# Patient Record
Sex: Female | Born: 1975 | Race: Black or African American | Hispanic: No | Marital: Married | Smoking: Light tobacco smoker
Health system: Southern US, Community
[De-identification: ages and names within clinical notes are randomized; demographics above are authoritative.]

## PROBLEM LIST (undated history)

## (undated) DIAGNOSIS — I499 Cardiac arrhythmia, unspecified: Secondary | ICD-10-CM

## (undated) DIAGNOSIS — F329 Major depressive disorder, single episode, unspecified: Secondary | ICD-10-CM

## (undated) DIAGNOSIS — I10 Essential (primary) hypertension: Secondary | ICD-10-CM

## (undated) DIAGNOSIS — L709 Acne, unspecified: Secondary | ICD-10-CM

## (undated) DIAGNOSIS — D62 Acute posthemorrhagic anemia: Secondary | ICD-10-CM

## (undated) DIAGNOSIS — F32A Depression, unspecified: Secondary | ICD-10-CM

## (undated) DIAGNOSIS — Z8619 Personal history of other infectious and parasitic diseases: Secondary | ICD-10-CM

## (undated) DIAGNOSIS — O10919 Unspecified pre-existing hypertension complicating pregnancy, unspecified trimester: Secondary | ICD-10-CM

## (undated) DIAGNOSIS — E785 Hyperlipidemia, unspecified: Secondary | ICD-10-CM

## (undated) DIAGNOSIS — Z98891 History of uterine scar from previous surgery: Secondary | ICD-10-CM

## (undated) HISTORY — DX: Hyperlipidemia, unspecified: E78.5

## (undated) HISTORY — DX: Major depressive disorder, single episode, unspecified: F32.9

## (undated) HISTORY — DX: Cardiac arrhythmia, unspecified: I49.9

## (undated) HISTORY — DX: Essential (primary) hypertension: I10

## (undated) HISTORY — DX: Personal history of other infectious and parasitic diseases: Z86.19

## (undated) HISTORY — PX: EYE SURGERY: SHX253

## (undated) HISTORY — PX: WISDOM TOOTH EXTRACTION: SHX21

## (undated) HISTORY — PX: BLEPHAROPLASTY: SUR158

## (undated) HISTORY — DX: Acne, unspecified: L70.9

## (undated) HISTORY — DX: Depression, unspecified: F32.A

---

## 2003-08-09 ENCOUNTER — Emergency Department (HOSPITAL_COMMUNITY): Admission: EM | Admit: 2003-08-09 | Discharge: 2003-08-09 | Payer: Self-pay | Admitting: Family Medicine

## 2004-11-13 ENCOUNTER — Emergency Department (HOSPITAL_COMMUNITY): Admission: EM | Admit: 2004-11-13 | Discharge: 2004-11-13 | Payer: Self-pay | Admitting: Emergency Medicine

## 2011-07-05 LAB — OB RESULTS CONSOLE ANTIBODY SCREEN: Antibody Screen: NEGATIVE

## 2011-07-05 LAB — OB RESULTS CONSOLE HEPATITIS B SURFACE ANTIGEN: Hepatitis B Surface Ag: NEGATIVE

## 2011-07-05 LAB — OB RESULTS CONSOLE RUBELLA ANTIBODY, IGM: Rubella: IMMUNE

## 2011-07-20 LAB — OB RESULTS CONSOLE ABO/RH

## 2011-07-20 LAB — OB RESULTS CONSOLE GC/CHLAMYDIA: Gonorrhea: NEGATIVE

## 2012-01-27 ENCOUNTER — Encounter (HOSPITAL_COMMUNITY): Payer: Self-pay | Admitting: *Deleted

## 2012-01-27 ENCOUNTER — Other Ambulatory Visit: Payer: Self-pay | Admitting: Obstetrics and Gynecology

## 2012-01-27 ENCOUNTER — Telehealth (HOSPITAL_COMMUNITY): Payer: Self-pay | Admitting: *Deleted

## 2012-01-27 NOTE — Telephone Encounter (Signed)
Preadmission screen  

## 2012-01-28 ENCOUNTER — Telehealth (HOSPITAL_COMMUNITY): Payer: Self-pay | Admitting: *Deleted

## 2012-01-28 ENCOUNTER — Encounter (HOSPITAL_COMMUNITY): Payer: Self-pay | Admitting: *Deleted

## 2012-01-28 NOTE — Telephone Encounter (Signed)
Preadmission screen  

## 2012-02-05 ENCOUNTER — Encounter (HOSPITAL_COMMUNITY): Payer: Self-pay

## 2012-02-05 ENCOUNTER — Inpatient Hospital Stay (HOSPITAL_COMMUNITY)
Admission: AD | Admit: 2012-02-05 | Payer: BC Managed Care – PPO | Source: Ambulatory Visit | Admitting: Obstetrics and Gynecology

## 2012-02-05 ENCOUNTER — Encounter (HOSPITAL_COMMUNITY): Payer: Self-pay | Admitting: Pharmacist

## 2012-02-05 ENCOUNTER — Inpatient Hospital Stay (HOSPITAL_COMMUNITY)
Admission: RE | Admit: 2012-02-05 | Discharge: 2012-02-10 | DRG: 650 | Disposition: A | Discharge status: Home / Self Care | Payer: BC Managed Care – PPO | Source: Ambulatory Visit | Attending: Obstetrics and Gynecology | Admitting: Obstetrics and Gynecology

## 2012-02-05 VITALS — BP 130/80 | HR 97 | Temp 98.2°F | Resp 18 | Ht 64.0 in | Wt 227.0 lb

## 2012-02-05 DIAGNOSIS — O411090 Infection of amniotic sac and membranes, unspecified, unspecified trimester, not applicable or unspecified: Secondary | ICD-10-CM | POA: Diagnosis present

## 2012-02-05 DIAGNOSIS — Z98891 History of uterine scar from previous surgery: Secondary | ICD-10-CM

## 2012-02-05 DIAGNOSIS — O1002 Pre-existing essential hypertension complicating childbirth: Principal | ICD-10-CM | POA: Diagnosis present

## 2012-02-05 DIAGNOSIS — D62 Acute posthemorrhagic anemia: Secondary | ICD-10-CM | POA: Diagnosis not present

## 2012-02-05 DIAGNOSIS — Z22330 Carrier of Group B streptococcus: Secondary | ICD-10-CM

## 2012-02-05 DIAGNOSIS — O99892 Other specified diseases and conditions complicating childbirth: Secondary | ICD-10-CM | POA: Diagnosis present

## 2012-02-05 DIAGNOSIS — O9903 Anemia complicating the puerperium: Secondary | ICD-10-CM | POA: Diagnosis not present

## 2012-02-05 DIAGNOSIS — O324XX0 Maternal care for high head at term, not applicable or unspecified: Secondary | ICD-10-CM | POA: Diagnosis present

## 2012-02-05 DIAGNOSIS — O10919 Unspecified pre-existing hypertension complicating pregnancy, unspecified trimester: Secondary | ICD-10-CM | POA: Diagnosis present

## 2012-02-05 HISTORY — DX: Unspecified pre-existing hypertension complicating pregnancy, unspecified trimester: O10.919

## 2012-02-05 HISTORY — DX: History of uterine scar from previous surgery: Z98.891

## 2012-02-05 HISTORY — DX: Acute posthemorrhagic anemia: D62

## 2012-02-05 LAB — CBC
Hemoglobin: 11.8 g/dL — ABNORMAL LOW (ref 12.0–15.0)
MCH: 29.4 pg (ref 26.0–34.0)
MCV: 86.3 fL (ref 78.0–100.0)
RBC: 4.01 MIL/uL (ref 3.87–5.11)

## 2012-02-05 MED ORDER — LACTATED RINGERS IV SOLN
500.0000 mL | INTRAVENOUS | Status: DC | PRN
Start: 2012-02-05 — End: 2012-02-07

## 2012-02-05 MED ORDER — LACTATED RINGERS IV SOLN
INTRAVENOUS | Status: DC
  Administered 2012-02-05 – 2012-02-07 (×6): via INTRAVENOUS

## 2012-02-05 MED ORDER — OXYTOCIN BOLUS FROM INFUSION
500.0000 mL | Freq: Once | INTRAVENOUS | Status: DC
  Filled 2012-02-05: qty 500

## 2012-02-05 MED ORDER — TERBUTALINE SULFATE 1 MG/ML IJ SOLN: 0.2500 mg | Freq: Once | INTRAMUSCULAR | Status: AC | PRN

## 2012-02-05 MED ORDER — OXYTOCIN 40 UNITS IN LACTATED RINGERS INFUSION - SIMPLE MED
1.0000 m[IU]/min | INTRAVENOUS | Status: DC
  Administered 2012-02-05: 2 m[IU]/min via INTRAVENOUS
  Administered 2012-02-06: 18 m[IU]/min via INTRAVENOUS

## 2012-02-05 MED ORDER — IBUPROFEN 600 MG PO TABS: 600.0000 mg | ORAL_TABLET | Freq: Four times a day (QID) | ORAL | Status: DC | PRN

## 2012-02-05 MED ORDER — OXYTOCIN 40 UNITS IN LACTATED RINGERS INFUSION - SIMPLE MED
62.5000 mL/h | Freq: Once | INTRAVENOUS | Status: DC
  Filled 2012-02-05: qty 1000

## 2012-02-05 MED ORDER — CITRIC ACID-SODIUM CITRATE 334-500 MG/5ML PO SOLN
30.0000 mL | ORAL | Status: DC | PRN
  Administered 2012-02-07: 30 mL via ORAL
  Filled 2012-02-05: qty 15

## 2012-02-05 MED ORDER — MISOPROSTOL 25 MCG QUARTER TABLET: 25.0000 ug | ORAL_TABLET | ORAL | Status: DC | PRN

## 2012-02-05 MED ORDER — ACETAMINOPHEN 325 MG PO TABS
650.0000 mg | ORAL_TABLET | ORAL | Status: DC | PRN
  Administered 2012-02-06 – 2012-02-07 (×3): 650 mg via ORAL
  Filled 2012-02-05: qty 2
  Filled 2012-02-05: qty 1
  Filled 2012-02-05 (×2): qty 2

## 2012-02-05 MED ORDER — FLEET ENEMA 7-19 GM/118ML RE ENEM: 1.0000 | ENEMA | Freq: Every day | RECTAL | Status: DC | PRN

## 2012-02-05 MED ORDER — OXYCODONE-ACETAMINOPHEN 5-325 MG PO TABS
1.0000 | ORAL_TABLET | ORAL | Status: DC | PRN
  Administered 2012-02-05: 2 via ORAL
  Filled 2012-02-05: qty 2

## 2012-02-05 MED ORDER — LABETALOL HCL 100 MG PO TABS
100.0000 mg | ORAL_TABLET | Freq: Two times a day (BID) | ORAL | Status: DC
  Administered 2012-02-05 – 2012-02-10 (×10): 100 mg via ORAL
  Filled 2012-02-05 (×12): qty 1

## 2012-02-05 MED ORDER — DEXTROSE 5 % IV SOLN
5.0000 10*6.[IU] | Freq: Once | INTRAVENOUS | Status: AC
  Administered 2012-02-05: 5 10*6.[IU] via INTRAVENOUS
  Filled 2012-02-05: qty 5

## 2012-02-05 MED ORDER — PENICILLIN G POTASSIUM 5000000 UNITS IJ SOLR
2.5000 10*6.[IU] | INTRAVENOUS | Status: DC
  Administered 2012-02-06 – 2012-02-07 (×8): 2.5 10*6.[IU] via INTRAVENOUS
  Filled 2012-02-05 (×12): qty 2.5

## 2012-02-05 MED ORDER — ONDANSETRON HCL 4 MG/2ML IJ SOLN: 4.0000 mg | Freq: Four times a day (QID) | INTRAMUSCULAR | Status: DC | PRN

## 2012-02-05 MED ORDER — LIDOCAINE HCL (PF) 1 % IJ SOLN
30.0000 mL | INTRAMUSCULAR | Status: DC | PRN
  Filled 2012-02-05: qty 30

## 2012-02-05 MED ORDER — ZOLPIDEM TARTRATE 5 MG PO TABS
5.0000 mg | ORAL_TABLET | Freq: Every evening | ORAL | Status: DC | PRN
  Administered 2012-02-05: 5 mg via ORAL
  Filled 2012-02-05: qty 1

## 2012-02-05 NOTE — H&P (Signed)
Kristen Thornton is a 36 y.o. female presenting @ 39 wk for induction 2nd to chronic HTN. Denies PIH warning signs History OB History    Grav Para Term Preterm Abortions TAB SAB Ect Mult Living   1 0 0 0 0 0 0 0 0 0      Past Medical History  Diagnosis Date  . Hypertension     labetolol  . Dysrhythmia     flutter  . H/O varicella    Past Surgical History  Procedure Date  . 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 Social History:  reports that she has been smoking.  She has never used smokeless tobacco. She reports that she does not drink alcohol or use illicit drugs.   Prenatal Transfer Tool  Maternal Diabetes: No Genetic Screening: Normal Maternal Ultrasounds/Referrals: Normal Fetal Ultrasounds or other Referrals:  None Maternal Substance Abuse:  No Significant Maternal Medications:  Meds include: Other:  Significant Maternal Lab Results:  Lab values include: Group B Strep positive Other Comments:  None  Review of Systems  All other systems reviewed and are negative.    Dilation: 1 Effacement (%): 60 Station: -3 Exam by:: dr Danis Pembleton Temperature 98.7 F (37.1 C), temperature source Axillary, resp. rate 18, height 5\' 4"  (1.626 m), weight 102.967 kg (227 lb), last menstrual period 05/08/2011. Maternal Exam:  Uterine Assessment: Contraction strength is mild.  Contraction frequency is irregular.   Abdomen: Patient reports no abdominal tenderness. Estimated fetal weight is 7lb 7 oz.   Fetal presentation: vertex  Introitus: Normal vulva. Ferning test: not done.  Nitrazine test: not done. Amniotic fluid character: not assessed.  Pelvis: adequate for delivery.   Cervix: Cervix evaluated by digital exam.     Physical Exam  Constitutional: She is oriented to person, place, and time. She appears well-developed  and well-nourished.  HENT:  Head: Normocephalic.  Neck: Neck supple.  Cardiovascular: Regular rhythm.   Respiratory: Breath sounds normal.  GI: Soft.  Musculoskeletal: She exhibits edema. She exhibits no tenderness.  Neurological: She is alert and oriented to person, place, and time.  Skin: Skin is warm and dry.    Prenatal labs: ABO, Rh: O/--/-- (03/12 0000) Antibody: Negative (02/25 0000) Rubella: Immune (02/25 0000) RPR: Nonreactive (02/25 0000)  HBsAg: Negative (02/25 0000)  HIV: Non-reactive (03/12 0000)  GBS: Positive (09/09 0000)   Assessment/Plan: Chronic HTN on labetalol Term gestation GBS cx (+) P)admit. Routine labs. Intracervical balloon due to ctx. Pitocin. Cont labetalol. IV  PCN   Madesyn Ast A 02/05/2012, 9:28 PM

## 2012-02-06 LAB — ABO/RH: ABO/RH(D): O POS

## 2012-02-06 LAB — RPR: RPR Ser Ql: NONREACTIVE

## 2012-02-06 LAB — CBC
Hemoglobin: 11.4 g/dL — ABNORMAL LOW (ref 12.0–15.0)
MCHC: 34.1 g/dL (ref 30.0–36.0)

## 2012-02-06 LAB — TYPE AND SCREEN

## 2012-02-06 MED ORDER — DIPHENHYDRAMINE HCL 50 MG/ML IJ SOLN: 12.5000 mg | INTRAMUSCULAR | Status: DC | PRN

## 2012-02-06 MED ORDER — NALBUPHINE SYRINGE 5 MG/0.5 ML
5.0000 mg | INJECTION | INTRAMUSCULAR | Status: DC | PRN
  Administered 2012-02-06 (×2): 5 mg via INTRAVENOUS
  Filled 2012-02-06 (×2): qty 0.5
  Filled 2012-02-06: qty 1

## 2012-02-06 MED ORDER — LACTATED RINGERS IV SOLN
500.0000 mL | Freq: Once | INTRAVENOUS | Status: AC
  Administered 2012-02-06: 500 mL via INTRAVENOUS

## 2012-02-06 MED ORDER — EPHEDRINE 5 MG/ML INJ: 10.0000 mg | INTRAVENOUS | Status: DC | PRN

## 2012-02-06 MED ORDER — PHENYLEPHRINE 40 MCG/ML (10ML) SYRINGE FOR IV PUSH (FOR BLOOD PRESSURE SUPPORT)
80.0000 ug | PREFILLED_SYRINGE | INTRAVENOUS | Status: DC | PRN
  Filled 2012-02-06: qty 5

## 2012-02-06 MED ORDER — LACTATED RINGERS IV SOLN
INTRAVENOUS | Status: DC
  Administered 2012-02-06: 09:00:00 via INTRAUTERINE
  Administered 2012-02-06: 150 mL/h via INTRAUTERINE

## 2012-02-06 MED ORDER — LIDOCAINE HCL (PF) 1 % IJ SOLN
INTRAMUSCULAR | Status: DC | PRN
  Administered 2012-02-06 (×2): 5 mL

## 2012-02-06 MED ORDER — EPHEDRINE 5 MG/ML INJ
10.0000 mg | INTRAVENOUS | Status: DC | PRN
  Filled 2012-02-06: qty 4

## 2012-02-06 MED ORDER — FENTANYL 2.5 MCG/ML BUPIVACAINE 1/10 % EPIDURAL INFUSION (WH - ANES)
14.0000 mL/h | INTRAMUSCULAR | Status: DC
  Administered 2012-02-06 – 2012-02-07 (×7): 14 mL/h via EPIDURAL
  Filled 2012-02-06 (×7): qty 60

## 2012-02-06 MED ORDER — PHENYLEPHRINE 40 MCG/ML (10ML) SYRINGE FOR IV PUSH (FOR BLOOD PRESSURE SUPPORT): 80.0000 ug | PREFILLED_SYRINGE | INTRAVENOUS | Status: DC | PRN

## 2012-02-06 NOTE — Progress Notes (Signed)
Kristen Thornton is a 36 y.o. G1P0000 at [redacted]w[redacted]d by LMP admitted for induction of labor due to chronic HTN.  Subjective: No chief complaint on file. c/o painful ctx. Request epidural. SROM clear fluid @ 6 am  Objective: BP 142/88  Pulse 87  Temp 98.5 F (36.9 C) (Oral)  Resp 20  Ht 5\' 4"  (1.626 m)  Wt 102.967 kg (227 lb)  BMI 38.96 kg/m2  LMP 05/08/2011     Pitocin 12 MIU  FHT:  FHR: 150 bpm, variability: moderate,  accelerations:  Present,  decelerations:  Present variable UC:   irregular, every 2-3 minutes SVE:   4 cm dilated, 80% effaced, -2 station. Foley in vagina removed. IUPC placed. ISE placed Tracing: baseline 150 (+) accel to 160  Labs: Lab Results  Component Value Date   WBC 9.3 02/05/2012   HGB 11.8* 02/05/2012   HCT 34.6* 02/05/2012   MCV 86.3 02/05/2012   PLT 217 02/05/2012    Assessment / Plan: Induction of labor due to chronic HTN,  progressing well on pitocin Chronic HTN on labetalol Labor GBS cx (+) on IV PCN  P) Epidural. Amnioinfusion. Continue pitocin  Anticipated MOD:  NSVD  Jamile Rekowski A 02/06/2012, 8:06 AM

## 2012-02-06 NOTE — Progress Notes (Signed)
Kristen Thornton is a 36 y.o. G1P0000 at [redacted]w[redacted]d by LMP admitted for induction of labor due to chronic HTN.  Subjective: No chief complaint on file. comfortable  Objective: BP 143/75  Pulse 102  Temp 100.9 F (38.3 C) (Oral)  Resp 20  Ht 5\' 4"  (1.626 m)  Wt 102.967 kg (227 lb)  BMI 38.96 kg/m2  SpO2 100%  LMP 05/08/2011 I/O last 3 completed shifts: In: -  Out: 1200 [Urine:1200] Total I/O In: -  Out: 500 [Urine:500]  FHT:  FHR: 150 bpm, variability: moderate,  accelerations:  Present,  decelerations:  Absent UC:   regular, every 2 minutes SVE:   5/100/-2 small caput  Labs: Lab Results  Component Value Date   WBC 14.7* 02/06/2012   HGB 11.4* 02/06/2012   HCT 33.4* 02/06/2012   MCV 86.5 02/06/2012   PLT 200 02/06/2012    Assessment / Plan: Protracted active phase due to suboptimal ctx Chronic HTN  On labetalol P)d/c amnioinfusion. IUPC replaced due to out. Cont pitocin. Exaggerated left sims  Anticipated MOD:    Safiyyah Vasconez A 02/06/2012, 7:48 PM

## 2012-02-06 NOTE — Anesthesia Preprocedure Evaluation (Signed)
Anesthesia Evaluation  Patient identified by MRN, date of birth, ID band Patient awake    Reviewed: Allergy & Precautions, H&P , Patient's Chart, lab work & pertinent test results  Airway Mallampati: III TM Distance: >3 FB Neck ROM: full    Dental No notable dental hx.    Pulmonary neg pulmonary ROS,  breath sounds clear to auscultation  Pulmonary exam normal       Cardiovascular hypertension, negative cardio ROS  + dysrhythmias Rhythm:regular Rate:Normal     Neuro/Psych negative neurological ROS  negative psych ROS   GI/Hepatic negative GI ROS, Neg liver ROS,   Endo/Other  negative endocrine ROSMorbid obesity  Renal/GU negative Renal ROS     Musculoskeletal   Abdominal   Peds  Hematology negative hematology ROS (+)   Anesthesia Other Findings   Reproductive/Obstetrics (+) Pregnancy                           Anesthesia Physical Anesthesia Plan  ASA: III  Anesthesia Plan: Epidural   Post-op Pain Management:    Induction:   Airway Management Planned:   Additional Equipment:   Intra-op Plan:   Post-operative Plan:   Informed Consent: I have reviewed the patients History and Physical, chart, labs and discussed the procedure including the risks, benefits and alternatives for the proposed anesthesia with the patient or authorized representative who has indicated his/her understanding and acceptance.     Plan Discussed with:   Anesthesia Plan Comments:         Anesthesia Quick Evaluation

## 2012-02-06 NOTE — Progress Notes (Signed)
Kristen Thornton is a 36 y.o. G1P0000 at [redacted]w[redacted]d by LMP admitted for induction of labor due to chronic HTN.  Subjective: No chief complaint on file. Epidural Pitocin 16 MIU Notes some rectal pressure Objective: BP 139/74  Pulse 73  Temp 98.5 F (36.9 C) (Oral)  Resp 18  Ht 5\' 4"  (1.626 m)  Wt 102.967 kg (227 lb)  BMI 38.96 kg/m2  SpO2 100%  LMP 05/08/2011      FHT:  FHR: 150  bpm, variability: moderate,  accelerations:  Present,  decelerations:  Absent UC:  Ctx q 2-3 mins ~ 155-165MVU SVE:   4/100/-2 tight band. Narrow arch. amnioinfusion Labs: Lab Results  Component Value Date   WBC 14.7* 02/06/2012   HGB 11.4* 02/06/2012   HCT 33.4* 02/06/2012   MCV 86.5 02/06/2012   PLT 200 02/06/2012    Assessment / Plan: Protracted active phase due to suboptimal ctx.  GBS cx (+) on IV PCN Chronic HTN on med P) cont with pitocin. Cont labetalol. Cont amnioinfusion   Anticipated MOD:    Shepard Keltz A 02/06/2012, 2:08 PM

## 2012-02-06 NOTE — Anesthesia Procedure Notes (Signed)
Epidural Patient location during procedure: OB Start time: 02/06/2012 9:06 AM  Staffing Anesthesiologist: Brayton Caves R Performed by: anesthesiologist   Preanesthetic Checklist Completed: patient identified, site marked, surgical consent, pre-op evaluation, timeout performed, IV checked, risks and benefits discussed and monitors and equipment checked  Epidural Patient position: sitting Prep: site prepped and draped and DuraPrep Patient monitoring: continuous pulse ox and blood pressure Approach: midline Injection technique: LOR air and LOR saline  Needle:  Needle type: Tuohy  Needle gauge: 17 G Needle length: 9 cm and 9 Needle insertion depth: 7 cm Catheter type: closed end flexible Catheter size: 19 Gauge Catheter at skin depth: 11 cm Test dose: negative  Assessment Events: blood not aspirated, injection not painful, no injection resistance, negative IV test and no paresthesia  Additional Notes Patient identified.  Risk benefits discussed including failed block, incomplete pain control, headache, nerve damage, paralysis, blood pressure changes, nausea, vomiting, reactions to medication both toxic or allergic, and postpartum back pain.  Patient expressed understanding and wished to proceed.  All questions were answered.  Sterile technique used throughout procedure and epidural site dressed with sterile barrier dressing. No paresthesia or other complications noted.The patient did not experience any signs of intravascular injection such as tinnitus or metallic taste in mouth nor signs of intrathecal spread such as rapid motor block. Please see nursing notes for vital signs.

## 2012-02-06 NOTE — Progress Notes (Signed)
Kristen Thornton is a 36 y.o. G1P0000 at [redacted]w[redacted]d by LMP admitted for induction of labor due to chronic HTN.  Subjective: No chief complaint on file.  C/o painful ctx. S/p Nubain. Pitocin/ intracervical balloon in place Objective: BP 125/72  Pulse 92  Temp 98.4 F (36.9 C) (Oral)  Resp 18  Ht 5\' 4"  (1.626 m)  Wt 102.967 kg (227 lb)  BMI 38.96 kg/m2  LMP 05/08/2011      FHT:  FHR: 150 bpm, variability: moderate,  accelerations:  Present,  decelerations:  Absent UC:   irregular, every 2-4 minutes SVE:   Deferred Labs: Lab Results  Component Value Date   WBC 9.3 02/05/2012   HGB 11.8* 02/05/2012   HCT 34.6* 02/05/2012   MCV 86.3 02/05/2012   PLT 217 02/05/2012    Assessment / Plan: chronic HTn on labetalol GBS cx (+) on PCN  P) cont w/ present mgmt. Increase pitocin. Analgesic prn  Anticipated MOD:    Lonie Newsham A 02/06/2012, 5:20 AM

## 2012-02-07 ENCOUNTER — Encounter (HOSPITAL_COMMUNITY): Payer: Self-pay | Admitting: Anesthesiology

## 2012-02-07 ENCOUNTER — Encounter (HOSPITAL_COMMUNITY)
Admission: RE | Disposition: A | Discharge status: Home / Self Care | Payer: Self-pay | Source: Ambulatory Visit | Attending: Obstetrics and Gynecology

## 2012-02-07 ENCOUNTER — Encounter (HOSPITAL_COMMUNITY): Payer: Self-pay

## 2012-02-07 ENCOUNTER — Inpatient Hospital Stay (HOSPITAL_COMMUNITY): Payer: BC Managed Care – PPO | Admitting: Anesthesiology

## 2012-02-07 LAB — CBC
HCT: 30.4 % — ABNORMAL LOW (ref 36.0–46.0)
HCT: 24.3 % — ABNORMAL LOW (ref 36.0–46.0)
Hemoglobin: 10.2 g/dL — ABNORMAL LOW (ref 12.0–15.0)
MCH: 29.5 pg (ref 26.0–34.0)
MCH: 29.5 pg (ref 26.0–34.0)
MCHC: 33.7 g/dL (ref 30.0–36.0)
MCV: 87.9 fL (ref 78.0–100.0)
MCV: 87.4 fL (ref 78.0–100.0)
RBC: 3.46 MIL/uL — ABNORMAL LOW (ref 3.87–5.11)
RDW: 14 % (ref 11.5–15.5)

## 2012-02-07 LAB — CBC WITH DIFFERENTIAL/PLATELET
Eosinophils Relative: 0 % (ref 0–5)
Lymphocytes Relative: 8 % — ABNORMAL LOW (ref 12–46)
Lymphs Abs: 1.4 10*3/uL (ref 0.7–4.0)
MCV: 86.7 fL (ref 78.0–100.0)
Neutro Abs: 14.7 10*3/uL — ABNORMAL HIGH (ref 1.7–7.7)
Platelets: 178 10*3/uL (ref 150–400)
RBC: 3.77 MIL/uL — ABNORMAL LOW (ref 3.87–5.11)
WBC: 17.5 10*3/uL — ABNORMAL HIGH (ref 4.0–10.5)

## 2012-02-07 LAB — BASIC METABOLIC PANEL
CO2: 20 mEq/L (ref 19–32)
Chloride: 96 mEq/L (ref 96–112)
Glucose, Bld: 97 mg/dL (ref 70–99)
Potassium: 3.7 mEq/L (ref 3.5–5.1)
Sodium: 128 mEq/L — ABNORMAL LOW (ref 135–145)

## 2012-02-07 SURGERY — Surgical Case
Anesthesia: Regional | Site: Abdomen | Wound class: Clean Contaminated

## 2012-02-07 MED ORDER — ONDANSETRON HCL 4 MG/2ML IJ SOLN: 4.0000 mg | Freq: Three times a day (TID) | INTRAMUSCULAR | Status: DC | PRN

## 2012-02-07 MED ORDER — DIPHENHYDRAMINE HCL 50 MG/ML IJ SOLN: 12.5000 mg | INTRAMUSCULAR | Status: DC | PRN

## 2012-02-07 MED ORDER — OXYTOCIN 40 UNITS IN LACTATED RINGERS INFUSION - SIMPLE MED
INTRAVENOUS | Status: AC
  Administered 2012-02-07: 62.5 mL/h via INTRAVENOUS
  Filled 2012-02-07: qty 1000

## 2012-02-07 MED ORDER — SODIUM CHLORIDE 0.9 % IR SOLN
Status: DC | PRN
  Administered 2012-02-07: 1000 mL

## 2012-02-07 MED ORDER — DIBUCAINE 1 % RE OINT: 1.0000 "application " | TOPICAL_OINTMENT | RECTAL | Status: DC | PRN

## 2012-02-07 MED ORDER — SODIUM BICARBONATE 8.4 % IV SOLN
INTRAVENOUS | Status: DC | PRN
  Administered 2012-02-07: 3 mL via EPIDURAL

## 2012-02-07 MED ORDER — GENTAMICIN SULFATE 40 MG/ML IJ SOLN
150.0000 mg | Freq: Three times a day (TID) | INTRAVENOUS | Status: DC
  Administered 2012-02-07: 150 mg via INTRAVENOUS
  Filled 2012-02-07 (×3): qty 3.75

## 2012-02-07 MED ORDER — SCOPOLAMINE 1 MG/3DAYS TD PT72
MEDICATED_PATCH | TRANSDERMAL | Status: AC
  Administered 2012-02-07: 1.5 mg via TRANSDERMAL
  Filled 2012-02-07: qty 1

## 2012-02-07 MED ORDER — KETOROLAC TROMETHAMINE 30 MG/ML IJ SOLN: 30.0000 mg | Freq: Four times a day (QID) | INTRAMUSCULAR | Status: AC | PRN

## 2012-02-07 MED ORDER — FLEET ENEMA 7-19 GM/118ML RE ENEM: 1.0000 | ENEMA | Freq: Every day | RECTAL | Status: DC | PRN

## 2012-02-07 MED ORDER — HYDROMORPHONE HCL PF 1 MG/ML IJ SOLN
INTRAMUSCULAR | Status: AC
  Administered 2012-02-07: 0.5 mg via INTRAVENOUS
  Filled 2012-02-07: qty 1

## 2012-02-07 MED ORDER — TETANUS-DIPHTH-ACELL PERTUSSIS 5-2.5-18.5 LF-MCG/0.5 IM SUSP: 0.5000 mL | Freq: Once | INTRAMUSCULAR | Status: DC

## 2012-02-07 MED ORDER — SODIUM CHLORIDE 0.9 % IJ SOLN: 3.0000 mL | INTRAMUSCULAR | Status: DC | PRN

## 2012-02-07 MED ORDER — LACTATED RINGERS IV SOLN
INTRAVENOUS | Status: DC | PRN
  Administered 2012-02-07: 11:00:00 via INTRAVENOUS

## 2012-02-07 MED ORDER — DIPHENHYDRAMINE HCL 50 MG/ML IJ SOLN: 25.0000 mg | INTRAMUSCULAR | Status: DC | PRN

## 2012-02-07 MED ORDER — FERROUS SULFATE 325 (65 FE) MG PO TABS
325.0000 mg | ORAL_TABLET | Freq: Two times a day (BID) | ORAL | Status: DC
  Administered 2012-02-07 – 2012-02-09 (×4): 325 mg via ORAL
  Filled 2012-02-07 (×4): qty 1

## 2012-02-07 MED ORDER — IBUPROFEN 600 MG PO TABS: 600.0000 mg | ORAL_TABLET | Freq: Four times a day (QID) | ORAL | Status: DC | PRN

## 2012-02-07 MED ORDER — BUPIVACAINE HCL (PF) 0.25 % IJ SOLN
INTRAMUSCULAR | Status: DC | PRN
  Administered 2012-02-07: 8 mL

## 2012-02-07 MED ORDER — MORPHINE SULFATE (PF) 0.5 MG/ML IJ SOLN
INTRAMUSCULAR | Status: DC | PRN
  Administered 2012-02-07: 3 mg via EPIDURAL
  Administered 2012-02-07: 2 mg via INTRAVENOUS

## 2012-02-07 MED ORDER — KETOROLAC TROMETHAMINE 60 MG/2ML IM SOLN
INTRAMUSCULAR | Status: AC
  Administered 2012-02-07: 60 mg via INTRAMUSCULAR
  Filled 2012-02-07: qty 2

## 2012-02-07 MED ORDER — FENTANYL CITRATE 0.05 MG/ML IJ SOLN
INTRAMUSCULAR | Status: DC | PRN
  Administered 2012-02-07: 100 ug via INTRAVENOUS

## 2012-02-07 MED ORDER — DIPHENHYDRAMINE HCL 25 MG PO CAPS: 25.0000 mg | ORAL_CAPSULE | ORAL | Status: DC | PRN

## 2012-02-07 MED ORDER — MEPERIDINE HCL 25 MG/ML IJ SOLN: 6.2500 mg | INTRAMUSCULAR | Status: DC | PRN

## 2012-02-07 MED ORDER — PHENYLEPHRINE 40 MCG/ML (10ML) SYRINGE FOR IV PUSH (FOR BLOOD PRESSURE SUPPORT)
PREFILLED_SYRINGE | INTRAVENOUS | Status: AC
  Filled 2012-02-07: qty 5

## 2012-02-07 MED ORDER — PHENYLEPHRINE HCL 10 MG/ML IJ SOLN
INTRAMUSCULAR | Status: DC | PRN
  Administered 2012-02-07: 80 ug via INTRAVENOUS
  Administered 2012-02-07 (×2): 40 ug via INTRAVENOUS

## 2012-02-07 MED ORDER — SENNOSIDES-DOCUSATE SODIUM 8.6-50 MG PO TABS
2.0000 | ORAL_TABLET | Freq: Every day | ORAL | Status: DC
  Administered 2012-02-07 – 2012-02-09 (×3): 2 via ORAL

## 2012-02-07 MED ORDER — BUPIVACAINE HCL (PF) 0.25 % IJ SOLN
INTRAMUSCULAR | Status: AC
  Filled 2012-02-07: qty 30

## 2012-02-07 MED ORDER — METOCLOPRAMIDE HCL 5 MG/ML IJ SOLN: 10.0000 mg | Freq: Three times a day (TID) | INTRAMUSCULAR | Status: DC | PRN

## 2012-02-07 MED ORDER — SCOPOLAMINE 1 MG/3DAYS TD PT72
1.0000 | MEDICATED_PATCH | Freq: Once | TRANSDERMAL | Status: DC
  Administered 2012-02-07: 1.5 mg via TRANSDERMAL

## 2012-02-07 MED ORDER — SIMETHICONE 80 MG PO CHEW
80.0000 mg | CHEWABLE_TABLET | Freq: Three times a day (TID) | ORAL | Status: DC
  Administered 2012-02-07 – 2012-02-09 (×8): 80 mg via ORAL

## 2012-02-07 MED ORDER — HYDROMORPHONE HCL PF 1 MG/ML IJ SOLN
0.2500 mg | INTRAMUSCULAR | Status: DC | PRN
  Administered 2012-02-07 (×2): 0.5 mg via INTRAVENOUS

## 2012-02-07 MED ORDER — WITCH HAZEL-GLYCERIN EX PADS: 1.0000 "application " | MEDICATED_PAD | CUTANEOUS | Status: DC | PRN

## 2012-02-07 MED ORDER — IBUPROFEN 600 MG PO TABS
600.0000 mg | ORAL_TABLET | Freq: Four times a day (QID) | ORAL | Status: DC
  Administered 2012-02-07 – 2012-02-10 (×10): 600 mg via ORAL
  Filled 2012-02-07 (×10): qty 1

## 2012-02-07 MED ORDER — KETOROLAC TROMETHAMINE 60 MG/2ML IM SOLN
60.0000 mg | Freq: Once | INTRAMUSCULAR | Status: AC | PRN
  Administered 2012-02-07: 60 mg via INTRAMUSCULAR

## 2012-02-07 MED ORDER — ONDANSETRON HCL 4 MG/2ML IJ SOLN: 4.0000 mg | INTRAMUSCULAR | Status: DC | PRN

## 2012-02-07 MED ORDER — OXYTOCIN 10 UNIT/ML IJ SOLN
INTRAMUSCULAR | Status: DC | PRN
  Administered 2012-02-07: 40 [IU] via INTRAMUSCULAR

## 2012-02-07 MED ORDER — LANOLIN HYDROUS EX OINT: 1.0000 "application " | TOPICAL_OINTMENT | CUTANEOUS | Status: DC | PRN

## 2012-02-07 MED ORDER — DIPHENHYDRAMINE HCL 25 MG PO CAPS: 25.0000 mg | ORAL_CAPSULE | Freq: Four times a day (QID) | ORAL | Status: DC | PRN

## 2012-02-07 MED ORDER — SODIUM CHLORIDE 0.9 % IV SOLN: 250.0000 mL | INTRAVENOUS | Status: DC

## 2012-02-07 MED ORDER — ONDANSETRON HCL 4 MG PO TABS: 4.0000 mg | ORAL_TABLET | ORAL | Status: DC | PRN

## 2012-02-07 MED ORDER — FENTANYL CITRATE 0.05 MG/ML IJ SOLN
INTRAMUSCULAR | Status: AC
  Filled 2012-02-07: qty 2

## 2012-02-07 MED ORDER — NALBUPHINE HCL 10 MG/ML IJ SOLN
5.0000 mg | INTRAMUSCULAR | Status: DC | PRN
  Filled 2012-02-07: qty 1

## 2012-02-07 MED ORDER — PRENATAL MULTIVITAMIN CH
1.0000 | ORAL_TABLET | Freq: Every day | ORAL | Status: DC
  Filled 2012-02-07 (×3): qty 1

## 2012-02-07 MED ORDER — GENTAMICIN SULFATE 40 MG/ML IJ SOLN
180.0000 mg | INTRAVENOUS | Status: DC
  Administered 2012-02-08: 180 mg via INTRAVENOUS
  Filled 2012-02-07: qty 4.5

## 2012-02-07 MED ORDER — ONDANSETRON HCL 4 MG/2ML IJ SOLN
INTRAMUSCULAR | Status: AC
  Filled 2012-02-07: qty 2

## 2012-02-07 MED ORDER — OXYTOCIN 10 UNIT/ML IJ SOLN
INTRAMUSCULAR | Status: AC
  Filled 2012-02-07: qty 1

## 2012-02-07 MED ORDER — SODIUM CHLORIDE 0.9 % IJ SOLN: 3.0000 mL | Freq: Two times a day (BID) | INTRAMUSCULAR | Status: DC

## 2012-02-07 MED ORDER — LACTATED RINGERS IV SOLN
INTRAVENOUS | Status: DC
  Administered 2012-02-07 – 2012-02-09 (×2): via INTRAVENOUS

## 2012-02-07 MED ORDER — NALOXONE HCL 0.4 MG/ML IJ SOLN: 0.4000 mg | INTRAMUSCULAR | Status: DC | PRN

## 2012-02-07 MED ORDER — OXYTOCIN 40 UNITS IN LACTATED RINGERS INFUSION - SIMPLE MED
62.5000 mL/h | INTRAVENOUS | Status: AC
  Administered 2012-02-07: 62.5 mL/h via INTRAVENOUS

## 2012-02-07 MED ORDER — SODIUM CHLORIDE 0.9 % IV SOLN: 1.0000 ug/kg/h | INTRAVENOUS | Status: DC | PRN

## 2012-02-07 MED ORDER — SODIUM BICARBONATE 8.4 % IV SOLN
INTRAVENOUS | Status: AC
  Filled 2012-02-07: qty 50

## 2012-02-07 MED ORDER — ZOLPIDEM TARTRATE 5 MG PO TABS: 5.0000 mg | ORAL_TABLET | Freq: Every evening | ORAL | Status: DC | PRN

## 2012-02-07 MED ORDER — CLINDAMYCIN PHOSPHATE 900 MG/50ML IV SOLN
900.0000 mg | Freq: Once | INTRAVENOUS | Status: AC
  Administered 2012-02-07: 900 mg via INTRAVENOUS
  Filled 2012-02-07 (×2): qty 50

## 2012-02-07 MED ORDER — BISACODYL 10 MG RE SUPP
10.0000 mg | Freq: Every day | RECTAL | Status: DC | PRN
  Filled 2012-02-07: qty 1

## 2012-02-07 MED ORDER — SODIUM CHLORIDE 0.9 % IV SOLN
2.0000 g | Freq: Four times a day (QID) | INTRAVENOUS | Status: DC
  Administered 2012-02-07 – 2012-02-09 (×9): 2 g via INTRAVENOUS
  Filled 2012-02-07 (×11): qty 2000

## 2012-02-07 MED ORDER — LIDOCAINE-EPINEPHRINE (PF) 2 %-1:200000 IJ SOLN
INTRAMUSCULAR | Status: AC
  Filled 2012-02-07: qty 20

## 2012-02-07 MED ORDER — MENTHOL 3 MG MT LOZG: 1.0000 | LOZENGE | OROMUCOSAL | Status: DC | PRN

## 2012-02-07 MED ORDER — MORPHINE SULFATE 0.5 MG/ML IJ SOLN
INTRAMUSCULAR | Status: AC
  Filled 2012-02-07: qty 10

## 2012-02-07 MED ORDER — LACTATED RINGERS IV SOLN
INTRAVENOUS | Status: DC | PRN
  Administered 2012-02-07 (×3): via INTRAVENOUS

## 2012-02-07 MED ORDER — SIMETHICONE 80 MG PO CHEW: 80.0000 mg | CHEWABLE_TABLET | ORAL | Status: DC | PRN

## 2012-02-07 MED ORDER — OXYCODONE-ACETAMINOPHEN 5-325 MG PO TABS
1.0000 | ORAL_TABLET | ORAL | Status: DC | PRN
  Administered 2012-02-08 (×2): 1 via ORAL
  Administered 2012-02-08: 2 via ORAL
  Administered 2012-02-09 (×2): 1 via ORAL
  Administered 2012-02-09 – 2012-02-10 (×2): 2 via ORAL
  Filled 2012-02-07 (×2): qty 1
  Filled 2012-02-07: qty 2
  Filled 2012-02-07 (×2): qty 1
  Filled 2012-02-07 (×2): qty 2

## 2012-02-07 SURGICAL SUPPLY — 44 items
BARRIER ADHS 3X4 INTERCEED (GAUZE/BANDAGES/DRESSINGS) ×2 IMPLANT
BENZOIN TINCTURE PRP APPL 2/3 (GAUZE/BANDAGES/DRESSINGS) IMPLANT
CLOTH BEACON ORANGE TIMEOUT ST (SAFETY) ×2 IMPLANT
CONTAINER PREFILL 10% NBF 15ML (MISCELLANEOUS) IMPLANT
DRAPE SURG 17X23 STRL (DRAPES) ×2 IMPLANT
DRESSING TELFA 8X3 (GAUZE/BANDAGES/DRESSINGS) ×2 IMPLANT
DRSG COVADERM 4X10 (GAUZE/BANDAGES/DRESSINGS) ×4 IMPLANT
DURAPREP 26ML APPLICATOR (WOUND CARE) ×2 IMPLANT
ELECT REM PT RETURN 9FT ADLT (ELECTROSURGICAL) ×2
ELECTRODE REM PT RTRN 9FT ADLT (ELECTROSURGICAL) ×1 IMPLANT
EXTRACTOR VACUUM KIWI (MISCELLANEOUS) IMPLANT
EXTRACTOR VACUUM M CUP 4 TUBE (SUCTIONS) IMPLANT
GAUZE SPONGE 4X4 12PLY STRL LF (GAUZE/BANDAGES/DRESSINGS) ×4 IMPLANT
GLOVE BIO SURGEON STRL SZ 6.5 (GLOVE) ×2 IMPLANT
GLOVE BIOGEL PI IND STRL 7.0 (GLOVE) ×2 IMPLANT
GLOVE BIOGEL PI INDICATOR 7.0 (GLOVE) ×2
GOWN PREVENTION PLUS LG XLONG (DISPOSABLE) ×6 IMPLANT
KIT ABG SYR 3ML LUER SLIP (SYRINGE) IMPLANT
NEEDLE HYPO 25X1 1.5 SAFETY (NEEDLE) IMPLANT
NEEDLE HYPO 25X5/8 SAFETYGLIDE (NEEDLE) IMPLANT
NS IRRIG 1000ML POUR BTL (IV SOLUTION) ×2 IMPLANT
PACK C SECTION WH (CUSTOM PROCEDURE TRAY) ×2 IMPLANT
PAD ABD 7.5X8 STRL (GAUZE/BANDAGES/DRESSINGS) ×2 IMPLANT
PAD OB MATERNITY 4.3X12.25 (PERSONAL CARE ITEMS) ×2 IMPLANT
RTRCTR C-SECT PINK 25CM LRG (MISCELLANEOUS) IMPLANT
SLEEVE SCD COMPRESS KNEE MED (MISCELLANEOUS) ×2 IMPLANT
STAPLER VISISTAT 35W (STAPLE) ×2 IMPLANT
STRIP CLOSURE SKIN 1/2X4 (GAUZE/BANDAGES/DRESSINGS) IMPLANT
SUT CHROMIC GUT AB #0 18 (SUTURE) IMPLANT
SUT MNCRL 0 VIOLET CTX 36 (SUTURE) ×3 IMPLANT
SUT MON AB 4-0 PS1 27 (SUTURE) IMPLANT
SUT MONOCRYL 0 CTX 36 (SUTURE) ×3
SUT PLAIN 2 0 (SUTURE)
SUT PLAIN 2 0 XLH (SUTURE) ×2 IMPLANT
SUT PLAIN ABS 2-0 CT1 27XMFL (SUTURE) IMPLANT
SUT VIC AB 0 CT1 27 (SUTURE) ×2
SUT VIC AB 0 CT1 27XBRD ANBCTR (SUTURE) ×2 IMPLANT
SUT VIC AB 2-0 CT1 27 (SUTURE) ×1
SUT VIC AB 2-0 CT1 TAPERPNT 27 (SUTURE) ×1 IMPLANT
SYR CONTROL 10ML LL (SYRINGE) ×2 IMPLANT
TAPE CLOTH SURG 4X10 WHT LF (GAUZE/BANDAGES/DRESSINGS) ×2 IMPLANT
TOWEL OR 17X24 6PK STRL BLUE (TOWEL DISPOSABLE) ×4 IMPLANT
TRAY FOLEY CATH 14FR (SET/KITS/TRAYS/PACK) IMPLANT
WATER STERILE IRR 1000ML POUR (IV SOLUTION) ×2 IMPLANT

## 2012-02-07 NOTE — Addendum Note (Signed)
Addendum  created 02/07/12 1819 by Shanon Payor, CRNA   Modules edited:Notes Section

## 2012-02-07 NOTE — Progress Notes (Signed)
ANTIBIOTIC CONSULT NOTE - INITIAL  Pharmacy Consult for Gentamicin Indication: Chorioamnionitis  No Known Allergies  Patient Measurements: Height: 5\' 4"  (162.6 cm) Weight: 227 lb (102.967 kg) IBW/kg (Calculated) : 54.7 kg Adjusted Body Weight: 69 kg  Vital Signs: Temp: 99.8 F (37.7 C) (09/30 0646) Temp src: Oral (09/30 0646) BP: 139/89 mmHg (09/30 0701) Pulse Rate: 96  (09/30 0701)  Labs:  Basename 02/06/12 0830 02/05/12 2025  WBC 14.7* 9.3  HGB 11.4* 11.8*  PLT 200 217  LABCREA -- --  CREATININE -- --  CRCLEARANCE -- --     Microbiology: Recent Results (from the past 720 hour(s))  OB RESULTS CONSOLE GBS     Status: Normal      Component Value Range Status Comment   GBS Positive        Medications:  Ampicillin 2 grams IV Q 6 hr  Assessment: 36 y.o. female G1P0000 at [redacted]w[redacted]d  Estimated Ke = 0.296, estimated Vd = 0.35 L/kg  Goal of Therapy:  Gentamicin peak 6-8 mg/L and Trough < 1 mg/L  Plan:  Gentamicin 150 mg IV every 8 hrs  Check Scr with next labs if gentamicin continued. Will check gentamicin levels if continued > 72hr or clinically indicated.  Natasha Bence 02/07/2012,7:39 AM

## 2012-02-07 NOTE — Progress Notes (Signed)
Pt getting very tired. Wants to rest until Dr Cherly Hensen comes back.

## 2012-02-07 NOTE — Progress Notes (Signed)
ANTIBIOTIC CONSULT NOTE - Follow Up  Pharmacy Consult for Gentamicin Indication: Chorioamnionitis  Patient Measurements: Height: 5\' 4"  (162.6 cm) Weight: 227 lb (102.967 kg) IBW/kg (Calculated) : 54.7  Adjusted Body Weight: 69.2  Vital Signs: Temp: 98.3 F (36.8 C) (09/30 1425) Temp src: Oral (09/30 1425) BP: 108/36 mmHg (09/30 1425) Pulse Rate: 121  (09/30 1425)  Labs:  Basename 02/07/12 1224 02/07/12 0740 02/06/12 0830  WBC 21.0* 17.5* 14.7*  HGB 10.2* 11.2* 11.4*  PLT 177 178 200  LABCREA -- -- --  CREATININE -- 2.23* --  CRCLEARANCE -- -- --   Medications: Ampicillin 2g IV q6hr Gentamicin 150mg  IV q8hr  Assessment: 36 y.o. female G1P1001 S/P C/S at 39wks, baseline Scr elevated at 2.23 and estimated crcl between 28-30 ml/min. Estimated Ke = 0.103, Vd = 0.35L/kg  Goal of Therapy:  Gentamicin peak 6-8 mg/L and Trough < 1 mg/L  Plan:  Antibiotics continued postpartum. Will adjust Gentamicin dose to 180mg  IV Q24 due to increased Scr. Check Scr with next labs (10/1 am labs). Will check gentamicin levels if continued > 72hr or clinically indicated.  Michelene Heady Braxton 02/07/2012,2:54 PM

## 2012-02-07 NOTE — OR Nursing (Addendum)
Uterus massaged by S. Krue Peterka [employer] fundraiser. Two tubes of cord blood sent to lab.  Foley catheter in upon arrival to OR. Urine color-bloody.   20cc of blood evacuated from uterus during uterine massage.

## 2012-02-07 NOTE — Progress Notes (Signed)
VALENTYNA HAHNE is a 36 y.o. G1P0000 at [redacted]w[redacted]d by LMP admitted for induction of labor due to chronic HTN.  Subjective: No chief complaint on file. (-)complaint  Objective: BP 150/94  Pulse 96  Temp 99 F (37.2 C) (Oral)  Resp 20  Ht 5\' 4"  (1.626 m)  Wt 102.967 kg (227 lb)  BMI 38.96 kg/m2  SpO2 100%  LMP 05/08/2011 I/O last 3 completed shifts: In: -  Out: 1200 [Urine:1200] Total I/O In: -  Out: 500 [Urine:500]  FHT:  FHR: 150 bpm, variability: moderate,  accelerations:  Present,  decelerations:  Absent UC:   regular, every 2 - 3 mins minutes SVE:   9 cm/100/+1 Labs: Lab Results  Component Value Date   WBC 14.7* 02/06/2012   HGB 11.4* 02/06/2012   HCT 33.4* 02/06/2012   MCV 86.5 02/06/2012   PLT 200 02/06/2012   ucx sent Assessment / Plan: Protracted active phase Chronic HTN on labetalol GBS cx (+)  P) cont pitocin. Recheck in 2hrs. Watch temp  Anticipated MOD:  NSVD  Obie Kallenbach A 02/07/2012, 3:45 AM

## 2012-02-07 NOTE — Anesthesia Postprocedure Evaluation (Signed)
  Anesthesia Post-op Note  Patient: Kristen Thornton  Procedure(s) Performed: Procedure(s) (LRB) with comments: CESAREAN SECTION (N/A) - Primary cesarean section with delivery at  boy at  40.  Apgars 8/9.  Patient Location: Mother/Baby  Anesthesia Type: Epidural  Level of Consciousness: awake, alert  and oriented  Airway and Oxygen Therapy: Patient Spontanous Breathing  Post-op Pain: none  Post-op Assessment: Post-op Vital signs reviewed and Patient's Cardiovascular Status Stable  Post-op Vital Signs: Reviewed and stable  Complications: No apparent anesthesia complications

## 2012-02-07 NOTE — Brief Op Note (Signed)
02/05/2012 - 02/07/2012  12:19 PM  PATIENT:  Kristen Thornton  36 y.o. female  PRE-OPERATIVE DIAGNOSIS:  arrest of descent, chronic HTN,  Term gestation. chorioamnionitis  POST-OPERATIVE DIAGNOSIS:  arrest of descent, chorioamnionitis, chronic htn, term gestation  PROCEDURE:  Procedure(s) (LRB) with comments: CESAREAN SECTION (N/A) - Primary cesarean section with delivery at  boy at  26.  Apgars 8/9. 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   Delay start of Pharmacological VTE agent (>24hrs) due to surgical blood loss or risk of bleeding: no

## 2012-02-07 NOTE — Progress Notes (Signed)
Dr Juliene Pina called and updated on patient's increased lochia and vital signs. Orders received

## 2012-02-07 NOTE — Anesthesia Postprocedure Evaluation (Signed)
Anesthesia Post Note  Patient: Kristen Thornton  Procedure(s) Performed: Procedure(s) (LRB): CESAREAN SECTION (N/A)  Anesthesia type: Epidural  Patient location: PACU  Post pain: Pain level controlled  Post assessment: Post-op Vital signs reviewed  Last Vitals:  Filed Vitals:   02/07/12 1245  BP: 123/78  Pulse: 90  Temp:   Resp: 20    Post vital signs: Reviewed  Level of consciousness: awake  Complications: No apparent anesthesia complications

## 2012-02-07 NOTE — Progress Notes (Signed)
S: comfortable  O: pitocin 16 MIU  VE; 6-7/100/-1  Tracing: baseline 150 good variability Ctx q  1  1/2 mins  IMP: protracted active phase due to suboptimal ctx GBS cx (+) on PCN Chronic HTN on labetalol P) watch temp. Hydrate. Cont pitocin( may decrease to down regulate receptors)

## 2012-02-07 NOTE — Transfer of Care (Signed)
Immediate Anesthesia Transfer of Care Note  Patient: Kristen Thornton  Procedure(s) Performed: Procedure(s) (LRB) with comments: CESAREAN SECTION (N/A) - Primary cesarean section with delivery at  boy at  101.  Apgars 8/9.  Patient Location: PACU  Anesthesia Type: Epidural  Level of Consciousness: awake, alert  and oriented  Airway & Oxygen Therapy: Patient Spontanous Breathing  Post-op Assessment: Report given to PACU RN and Post -op Vital signs reviewed and stable  Post vital signs: Reviewed and stable  Complications: No apparent anesthesia complications

## 2012-02-07 NOTE — Progress Notes (Signed)
Discussing potential need for c section. Will reassess.

## 2012-02-08 ENCOUNTER — Encounter (HOSPITAL_COMMUNITY): Payer: Self-pay

## 2012-02-08 DIAGNOSIS — O10919 Unspecified pre-existing hypertension complicating pregnancy, unspecified trimester: Secondary | ICD-10-CM

## 2012-02-08 DIAGNOSIS — D62 Acute posthemorrhagic anemia: Secondary | ICD-10-CM

## 2012-02-08 DIAGNOSIS — Z98891 History of uterine scar from previous surgery: Secondary | ICD-10-CM

## 2012-02-08 HISTORY — DX: History of uterine scar from previous surgery: Z98.891

## 2012-02-08 HISTORY — DX: Acute posthemorrhagic anemia: D62

## 2012-02-08 HISTORY — DX: Unspecified pre-existing hypertension complicating pregnancy, unspecified trimester: O10.919

## 2012-02-08 LAB — URINE CULTURE

## 2012-02-08 LAB — CREATININE, SERUM: Creatinine, Ser: 0.92 mg/dL (ref 0.50–1.10)

## 2012-02-08 LAB — CBC
MCH: 29.8 pg (ref 26.0–34.0)
MCH: 29.7 pg (ref 26.0–34.0)
MCHC: 34.3 g/dL (ref 30.0–36.0)
MCV: 86.8 fL (ref 78.0–100.0)
Platelets: 135 10*3/uL — ABNORMAL LOW (ref 150–400)
Platelets: 156 10*3/uL (ref 150–400)
RDW: 14.1 % (ref 11.5–15.5)
RDW: 14.1 % (ref 11.5–15.5)

## 2012-02-08 MED ORDER — OXYTOCIN 40 UNITS IN LACTATED RINGERS INFUSION - SIMPLE MED
125.0000 mL/h | INTRAVENOUS | Status: DC
  Administered 2012-02-08: 125 mL/h via INTRAVENOUS
  Filled 2012-02-08: qty 1000

## 2012-02-08 MED ORDER — GENTAMICIN SULFATE 40 MG/ML IJ SOLN
180.0000 mg | Freq: Two times a day (BID) | INTRAVENOUS | Status: DC
  Administered 2012-02-08 – 2012-02-09 (×2): 180 mg via INTRAVENOUS
  Filled 2012-02-08 (×2): qty 4.5

## 2012-02-08 NOTE — Progress Notes (Addendum)
Subjective: POD# 1 Information for the patient's newborn:  APRILLE, SAWHNEY [[id number]]  female  / circ planned  Reports feeling well. Feeding: breast Patient reports tolerating PO.  Breast symptoms: sore L nipple, unable to latch on right. Pain controlled with Motrin Denies HA/SOB/C/P/N/V/dizziness. Flatus absent. She reports vaginal bleeding as normal, without clots. Ambulated to BR once, no syncope / dizziness. Foley cath [facility] this AM, no void yet.  Objective:   VS:  Filed Vitals:   02/08/12 0630 02/08/12 0757 02/08/12 0800 02/08/12 0803  BP: 114/68 120/69 115/70 119/70  Pulse: 100 99 100 110  Temp: 98.3 F (36.8 C) 98.9 F (37.2 C)    TempSrc: Oral Oral    Resp: 18 18 18 18   Height:      Weight:      SpO2: 98% 95% 99% 97%     Intake/Output Summary (Last 24 hours) at 02/08/12 0912 Last data filed at 02/08/12 0740  Gross per 24 hour  Intake   5937 ml  Output   4120 ml  Net   1817 ml        Basename 02/08/12 0515 02/07/12 1709  WBC 13.6* 19.6*  HGB 6.1* 8.2*  HCT 17.8* 24.3*  PLT 135* 184     Blood type: --/--/O POS, O POS (09/28 2000)  Rubella: Immune (02/25 0000)     Physical Exam:  General: alert, cooperative and no distress CV: Regular rate and rhythm Resp: clear Abdomen: NT, gas distention moderate, decreased bowel sounds.  Incision: clean, dry, intact and dressing in place Uterine Fundus: firm after bimanual massage, removed large clots x 2 (~ 200 cc) Lochia: small Ext: edema trace pedal and Homans sign is negative, no sign of DVT      Assessment/Plan: 36 y.o.   POD# 1. G1P1001                  Active Problems:  S/P cesarean section - arrest of descent (9/30)  Postpartum care following cesarean delivery  Acute blood loss anemia  Chronic hypertension complicating or reason for care during pregnancy  Doing well, stable.     BP stable on Labetalol Uterine atony w/ clots in lower segment, resolved after bimanual massage, will continue IV  Pitocin for now. On dual IV abx therapy, increased temp in labor, afebrile since delivery, plan complete 24 hrs afebrile on ABX   Mild tachy w/ standing, will repeat hgb level at noon. Blood transfusion offered, R/B discussed, patient prefers to wait and see how she feels with increased activity today.            Advance diet as tolerated Ambulate, warm PO fluids for increased gut motility Routine post-op care  Consult Dr. Alesia Banda 02/08/2012, 9:12 AM   Per Dr. Cherly Hensen, will continue Amp / Gent IV x 48 hrs post-op.  Rasheen Bells  02/08/2012 10:20 AM

## 2012-02-08 NOTE — Progress Notes (Signed)
ANTIBIOTIC CONSULT NOTE - Follow-up  Pharmacy Consult for Gentamicin Indication: Chorioamnionitis  No Known Allergies  Patient Measurements: Height: 5\' 4"  (162.6 cm) Weight: 227 lb (102.967 kg) IBW/kg (Calculated) : 54.7  Adjusted Body Weight: 69.1  Vital Signs: Temp: 98.1 F (36.7 C) (10/01 0954) Temp src: Oral (10/01 0757) BP: 115/73 mmHg (10/01 0956) Pulse Rate: 99  (10/01 0956)  Labs:  Basename 02/08/12 0515 02/07/12 1709 02/07/12 1224 02/07/12 0740  WBC 13.6* 19.6* 21.0* --  HGB 6.1* 8.2* 10.2* --  PLT 135* 184 177 --  LABCREA -- -- -- --  CREATININE 0.92 -- -- 2.23*  CRCLEARANCE -- -- -- --   Medications:  Gentamicin 180mg  IV Q24  Assessment: POD1 for C/S with Crcl improved. Scr = 0.92. Will adjust dosing interval based on improved renal function.  Goal of Therapy:  Gentamicin peak 6-8 mg/L and Trough < 1 mg/L  Plan:  Change to Gentamicin 180mg  IV q12hr. Plan is to continue antibiotics until 24hr afebrile. Will check gentamicin levels if continued > 72hr or clinically indicated.  Michelene Heady Braxton 02/08/2012,10:06 AM

## 2012-02-08 NOTE — Progress Notes (Signed)
S: comfortable Epidural. Notes some vaginal pressure  O: BP 147/86 T99.8( ax) Pitocin  VE: fully/ +1/+2 station (+) caput Tracing; baseline 155 ctx q 11/2- 3 mins MVU 110  IMP: Presumed chorioamnionitis Protracted active labor due to suboptimal labor force Chronic HTN on labetalol Complete  P) start A/G.  Check CBC. ucx sent. Tylenol prn fever. Start pushing

## 2012-02-08 NOTE — Progress Notes (Signed)
CNM Daniella P. notified of hemaglobin of 6.1 and orthostatic blood pressures and pulse of 110. Patient states she isn't dizzy or light headed at this time. No excessive bleeding noted. Patient told to ambulate halls today.

## 2012-02-08 NOTE — Progress Notes (Signed)
S: tired. Pushing >2 hrs Ready for C/S  O: BP 138/85  T 98.4 VE unchanged per RN  Tracing: baseline 150 reassuring Undulating ctx pattern  IMP: Arrest of descent Presumed chorioamnionitis Chronic HTn on labetalol P) Primary C/S. Procedure explained. Risk of surgery reviewed: infection, bleeding, injury to surrounding organ structure, internal scar tissue, possible blood transfusion. Will add Clindamycin. All ? Answered. Cont labetalol. OR  notified

## 2012-02-08 NOTE — Progress Notes (Signed)
Physician notified of pt's b/ps, questioning if labetalol should be given or held.  Dr Juliene Pina wanted med to be given.

## 2012-02-09 MED ORDER — DOCUSATE SODIUM 100 MG PO CAPS
100.0000 mg | ORAL_CAPSULE | Freq: Two times a day (BID) | ORAL | Status: DC
  Administered 2012-02-09 – 2012-02-10 (×3): 100 mg via ORAL
  Filled 2012-02-09 (×3): qty 1

## 2012-02-09 MED ORDER — POLYSACCHARIDE IRON COMPLEX 150 MG PO CAPS
150.0000 mg | ORAL_CAPSULE | Freq: Two times a day (BID) | ORAL | Status: DC
  Administered 2012-02-09 – 2012-02-10 (×3): 150 mg via ORAL
  Filled 2012-02-09 (×5): qty 1

## 2012-02-09 NOTE — Progress Notes (Signed)
UR chart review completed.  

## 2012-02-09 NOTE — Progress Notes (Addendum)
POSTOPERATIVE DAY # 2 S/P cesarean section - postpartum atony   S:         Reports feeling well - better today             Tolerating po intake / no  nausea / no  vomiting / + flatus / no BM             Bleeding is light             Pain controlled with motrin and percocet             Up ad lib / ambulatory without dizziness or SOB / voiding QS  Newborn breast feeding     O:  A & O x 3              VS: Blood pressure 119/69, pulse 78, temperature 97.9 F (36.6 C), temperature source Oral, resp. rate 18, height 5\' 4"  (1.626 m), weight 102.967 kg (227 lb), last menstrual period 05/08/2011, SpO2 98.00%, unknown if currently breastfeeding.             Pulse stable past 24 hours in 78-100 range / apical while ambulating in room 106  LABS:   14.9 - 6.5 -19.0 - 156  I&O:  -100  Lungs: Clear and unlabored  Heart: regular rate and rhythm / no mumurs  Abdomen: soft, non-tender, non-distended, active BS             Fundus: firm, non-tender, U-1             Dressing intact without drainage             Perineum: no edema  Lochia: light - no clots in past 24 hours  Extremities: trace edema, no calf pain or tenderness, negative Homans  A:        POD # 2 S/P cesarean section            ABL anemia - postoperative loss compounded by uterine atony postpartum - asymptomatic / stable            Declines transfusion            Presumed chorioamnionitis - afebrile >24 hours / uterus non-tender            Chronic hypertension - stable on labetalol  P:        Routine postoperative care              Iron (niferex) BID x 2 weeks then daily x 6 weeks             Repeat CBC and ferritin postpartum visit             DC IV abx              Maintain saline lock today - monitor for signs of tachycardia / SOB with ABL anemia               Marlinda Mike CNM, MSN 02/09/2012, 10:55 AM

## 2012-02-09 NOTE — Op Note (Signed)
Kristen Thornton, Kristen Thornton NO.:  [id number]  MEDICAL RECORD NO.:  [id number]  LOCATION:  JXBJYN                        FACILITY:  [facility]  PHYSICIAN:  Maxie Better, M.D.DATE OF BIRTH:  1975/10/05  DATE OF PROCEDURE:  02/07/2012 DATE OF DISCHARGE:                              OPERATIVE REPORT   PREOPERATIVE DIAGNOSES:  Arrest of descent, chorioamnionitis, chronic hypertension.  POSTOPERATIVE DIAGNOSES:  Right occiput posterior presentation, arrest of descent, chorioamnionitis, chronic hypertension, term gestation.  PROCEDURE:  Primary cesarean section, Kerr hysterotomy.  ANESTHESIA:  Epidural.  SURGEON:  Maxie Better, M.D.  ASSISTANT:  None.  PROCEDURE:  Under adequate epidural anesthesia, the patient was placed in the supine position with a left lateral tilt.  An indwelling Foley catheter was already in place; however, it was noted to be draining blood-tinged urine.  The patient was sterilely prepped and draped in usual fashion.  A 0.25% Marcaine was injected along the planned Pfannenstiel skin incision site.  Pfannenstiel skin incision was then made, carried down to the rectus fascia.  Rectus fascia was opened transversely.  Rectus fascia was then bluntly and sharply dissected off the rectus muscle in superior and inferior fashion.  The rectus muscle was split in midline.  The parietal peritoneum was entered sharply and extended.  An Alexis retractor was then placed.  Vesicouterine peritoneum was opened transversely.  The bladder was then bluntly dissected off the lower uterine segment and displaced inferiorly with a bladder retractor.  A curvilinear low transverse uterine incision was then made and extended with bandage scissors.  Subsequent delivery of a live female infant from the right occiput posterior presentation was accomplished.  There was a loop of cord around the neck x2.  Baby was bulb suctioned from the abdomen.  The cord was clamped, cut.   The baby was transferred to the awaiting pediatrician, who assigned Apgars of 8 and 9 at 1 and 5 minutes.  The placenta was manually removed.  Uterine cavity was cleaned of debris.  There was a 4-cm subserosal anterior fibroid noted.  Normal tubes and ovaries were noted bilaterally.  The uterine incision had an extension on the inferior aspect of about an inch long, that extension was then closed separately with 0 Monocryl running locked stitch.  The remaining incision was closed in the usual fashion using 0 Monocryl for the running locked stitch first layer, second layer was imbricated using 0 Monocryl suture.  Good hemostasis was noted at that point.  The abdomen was then copiously irrigated and suctioned of debris.  The Alexis retractor was removed.  Interceed was placed overlying the incision.  The parietal peritoneum was then closed with 2-0 Vicryl.  The rectus fascia was closed with 0 Vicryl x2.  The subcutaneous area was irrigated, small bleeder was cauterized. Interrupted 2-0 plain suture was placed and the skin approximated with Ethicon staples.  SPECIMENS:  Placenta sent to Pathology.  ESTIMATED BLOOD LOSS:  800 mL.  INTRAOPERATIVE FLUID:  2500 mL.  URINE OUTPUT:  250 mL of urine, which was clearing.  Sponge and instrument counts x2 was correct.  COMPLICATION:  None.  The patient was transferred to the recovery room in stable  condition.     Maxie Better, M.D.     [place]/MEDQ  D:  02/08/2012  T:  02/08/2012  Job:  [id number]

## 2012-02-10 ENCOUNTER — Encounter (HOSPITAL_COMMUNITY)
Admission: RE | Admit: 2012-02-10 | Discharge: 2012-02-10 | Disposition: A | Discharge status: Home / Self Care | Payer: BC Managed Care – PPO | Source: Ambulatory Visit | Attending: Obstetrics and Gynecology | Admitting: Obstetrics and Gynecology

## 2012-02-10 DIAGNOSIS — O923 Agalactia: Secondary | ICD-10-CM | POA: Insufficient documentation

## 2012-02-10 MED ORDER — IBUPROFEN 600 MG PO TABS: 600.0000 mg | ORAL_TABLET | Freq: Four times a day (QID) | ORAL | Status: DC

## 2012-02-10 MED ORDER — DSS 100 MG PO CAPS: 100.0000 mg | ORAL_CAPSULE | Freq: Two times a day (BID) | ORAL | Status: DC | PRN

## 2012-02-10 MED ORDER — POLYSACCHARIDE IRON COMPLEX 150 MG PO CAPS: 150.0000 mg | ORAL_CAPSULE | Freq: Two times a day (BID) | ORAL | Status: DC

## 2012-02-10 MED ORDER — OXYCODONE-ACETAMINOPHEN 5-325 MG PO TABS: 1.0000 | ORAL_TABLET | Freq: Four times a day (QID) | ORAL | Status: DC | PRN

## 2012-02-10 NOTE — Progress Notes (Signed)
Patient ID: Kristen Thornton, female   DOB: 1975-09-25, 37 y.o.   MRN: [id number] POD # 3  Subjective: Pt reports feeling well and desires d/c home today/ Pain controlled with ibuprofen and percocet Tolerating po/Voiding without problems/ No n/v/Flatus pos Some SOB with ambulation; no dizziness; no HA; no CP Activity: out of bed and ambulate Bleeding is light Newborn info:  Information for the patient's newborn:  MITALI, SHENEFIELD [[id number]]  female  / circ completed/ Feeding: bottle   Objective: VS: Blood pressure 130/80, pulse 97, temperature 98.2 F (36.8 C), temperature source Oral, resp. rate 18   LABS:  Basename 02/08/12 1016 02/08/12 0515  WBC 14.9* 13.6*  HGB 6.5* 6.1*  HCT 19.0* 17.8*  PLT 156 135*     Physical Exam:  General: alert, cooperative and no distress CV: Regular rate and rhythm Resp: clear Abdomen: soft, nontender, normal bowel sounds Incision: healing well, well approximated Uterine Fundus: firm, below umbilicus, nontender Lochia: minimal Ext: edema +1 and Homans sign is negative, no sign of DVT    A/P: POD # 3/ G1P1001/ S/P C/Section d/t failure to progress Severe ABL anemia.  Pt has been informed regarding infusion of PRBC's with risks/benefits reviewed.  Pt feels stable at this time and declines PRBC.  Reportable symptoms reviewed.  Urged po intake of foods rich in iron...spinach, organ meats, shell fish, fortified cereals CHTN; continue labetalol; recheck BP in office in 1 week. Doing well and stable for discharge home RX's: Ibuprofen 600mg  po Q 6 hrs prn pain #30 Refill x 1 Percocet 5/325 1 - 2 tabs po every 6 hrs prn pain  #30 No refill Niferex 150mg  po BID x 2 weeks and then QD  #60  Refill x 2 Colace 100mg  po BID x 1 - 2 weeks then QD prn constipation #60 Ref x1 Cont labetalol    Signed: Demetrius Revel, MSN, [facility] 02/10/2012, 9:10 AM

## 2012-02-10 NOTE — Discharge Summary (Addendum)
Obstetric Discharge Summary Reason for Admission: induction of labor and Chronic HTN @ 39wks Prenatal Procedures: NST and ultrasound Intrapartum Procedures: cesarean: low cervical, transverse Postpartum Procedures: none Complications-Operative and Postpartum: none Hemoglobin  Date Value Range Status  02/08/2012 6.5* 12.0 - 15.0 g/dL Final     REPEATED TO VERIFY     CRITICAL RESULT CALLED TO, READ BACK BY AND VERIFIED WITH:     BRANCH,N AT 1040 ON 100113 BY WOODSL     HCT  Date Value Range Status  02/08/2012 19.0* 36.0 - 46.0 % Final    Physical Exam:  General: alert, cooperative and no distress Lochia: appropriate Uterine Fundus: firm Incision: healing well DVT Evaluation: No evidence of DVT seen on physical exam. Negative Homan's sign.  Discharge Diagnoses: Term Pregnancy-delivered and Severe ABL anemia and chronic hypertension  Will recheck BP in the office in 1 week; CBC at [facility] visit  Discharge Information: Date: 02/10/2012 Activity: pelvic rest Diet: routine Medications: Ibuprofen, Colace, Iron and Percocet Condition: stable Instructions: refer to practice specific booklet Discharge to: home Follow-up Information    Follow up with Abagail Limb A, MD. In 6 weeks.   Contact information:   [address] [place] [address] [phone]          Newborn Data: Live born female 02/07/2012 Birth Weight: 8 lb 1.6 oz (3675 g) APGAR: 8, 9  Home with mother.  FISHER,JULIE K 02/10/2012, 9:36 AM

## 2012-08-15 ENCOUNTER — Ambulatory Visit (INDEPENDENT_AMBULATORY_CARE_PROVIDER_SITE_OTHER): Payer: BC Managed Care – PPO | Admitting: Family Medicine

## 2012-08-15 ENCOUNTER — Telehealth: Payer: Self-pay | Admitting: Family Medicine

## 2012-08-15 ENCOUNTER — Encounter: Payer: Self-pay | Admitting: Family Medicine

## 2012-08-15 VITALS — BP 154/110 | HR 88 | Temp 99.4°F | Resp 18 | Wt 203.0 lb

## 2012-08-15 DIAGNOSIS — L0390 Cellulitis, unspecified: Secondary | ICD-10-CM

## 2012-08-15 DIAGNOSIS — L0291 Cutaneous abscess, unspecified: Secondary | ICD-10-CM

## 2012-08-15 MED ORDER — DOXYCYCLINE HYCLATE 100 MG PO TABS: 100.0000 mg | ORAL_TABLET | Freq: Two times a day (BID) | ORAL | Status: DC

## 2012-08-15 NOTE — Progress Notes (Signed)
Subjective:     Patient ID: Kristen Thornton, female   DOB: 09/11/1975, 37 y.o.   MRN: [id number]  HPI This week the patient was scratched below her right eye on her cheek by her child. The scratches now become red hot swollen and infected. She also reports pain in the lymph nodes in her neck and around her right jaw.  She denies fever or vision changes. Past Medical History  Diagnosis Date  . Hypertension     labetolol  . Dysrhythmia     flutter  . H/O varicella   . S/P cesarean section - arrest of descent (9/30) 02/08/2012  . Postpartum care following cesarean delivery 02/08/2012  . Acute blood loss anemia 02/08/2012  . Chronic hypertension complicating or reason for care during pregnancy 02/08/2012   Current Outpatient Prescriptions on File Prior to Visit  Medication Sig Dispense Refill  . labetalol (NORMODYNE) 100 MG tablet Take 100 mg by mouth 2 (two) times daily.       No current facility-administered medications on file prior to visit.     Review of Systems  All other systems reviewed and are negative.       Objective:   Physical Exam  Constitutional: She appears well-developed and well-nourished.  HENT:  Head:    Cardiovascular: Normal rate and normal heart sounds.   Pulmonary/Chest: Effort normal and breath sounds normal.  Skin: There is erythema.   skin under right eye is red, hot, tender, swollen. She has a subcentimeter tender lymph node at the angle of the right mandible as well as in the right anterior cervical chain     Assessment:     cellulitis    Plan:     Doxycycline 100 mg by mouth twice a day x10 days she's to followup in 4 days if no better sooner if worse.

## 2012-08-15 NOTE — Telephone Encounter (Signed)
Ntbs, bring her in now
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
## 2012-08-15 NOTE — Telephone Encounter (Signed)
Dr Tanya Nones want to see pt ASAP.  Pt called and told to come in.

## 2012-08-30 ENCOUNTER — Ambulatory Visit: Payer: Self-pay | Admitting: Physician Assistant

## 2012-09-11 ENCOUNTER — Encounter: Payer: Self-pay | Admitting: Family Medicine

## 2012-09-11 DIAGNOSIS — L709 Acne, unspecified: Secondary | ICD-10-CM | POA: Insufficient documentation

## 2012-09-11 DIAGNOSIS — I10 Essential (primary) hypertension: Secondary | ICD-10-CM | POA: Insufficient documentation

## 2012-09-11 DIAGNOSIS — E785 Hyperlipidemia, unspecified: Secondary | ICD-10-CM | POA: Insufficient documentation

## 2012-09-13 ENCOUNTER — Ambulatory Visit: Payer: Self-pay | Admitting: Physician Assistant

## 2013-02-25 ENCOUNTER — Telehealth: Payer: Self-pay | Admitting: Family Medicine

## 2013-02-25 NOTE — Telephone Encounter (Signed)
Refill denied on dismissed patient

## 2013-03-01 ENCOUNTER — Telehealth: Payer: Self-pay | Admitting: Family Medicine

## 2013-03-01 NOTE — Telephone Encounter (Signed)
Dismissed patient refill denied

## 2013-05-11 ENCOUNTER — Emergency Department (HOSPITAL_COMMUNITY)
Admission: EM | Admit: 2013-05-11 | Discharge: 2013-05-11 | Disposition: A | Discharge status: Home / Self Care | Payer: BC Managed Care – PPO | Attending: Emergency Medicine | Admitting: Emergency Medicine

## 2013-05-11 ENCOUNTER — Encounter (HOSPITAL_COMMUNITY): Payer: Self-pay | Admitting: Emergency Medicine

## 2013-05-11 DIAGNOSIS — M545 Low back pain, unspecified: Secondary | ICD-10-CM | POA: Insufficient documentation

## 2013-05-11 DIAGNOSIS — Z9889 Other specified postprocedural states: Secondary | ICD-10-CM | POA: Insufficient documentation

## 2013-05-11 DIAGNOSIS — Z862 Personal history of diseases of the blood and blood-forming organs and certain disorders involving the immune mechanism: Secondary | ICD-10-CM | POA: Insufficient documentation

## 2013-05-11 DIAGNOSIS — Z8659 Personal history of other mental and behavioral disorders: Secondary | ICD-10-CM | POA: Insufficient documentation

## 2013-05-11 DIAGNOSIS — Z3202 Encounter for pregnancy test, result negative: Secondary | ICD-10-CM | POA: Insufficient documentation

## 2013-05-11 DIAGNOSIS — Z8639 Personal history of other endocrine, nutritional and metabolic disease: Secondary | ICD-10-CM | POA: Insufficient documentation

## 2013-05-11 DIAGNOSIS — R1033 Periumbilical pain: Secondary | ICD-10-CM | POA: Insufficient documentation

## 2013-05-11 DIAGNOSIS — F17200 Nicotine dependence, unspecified, uncomplicated: Secondary | ICD-10-CM | POA: Insufficient documentation

## 2013-05-11 DIAGNOSIS — R110 Nausea: Secondary | ICD-10-CM | POA: Insufficient documentation

## 2013-05-11 DIAGNOSIS — Z872 Personal history of diseases of the skin and subcutaneous tissue: Secondary | ICD-10-CM | POA: Insufficient documentation

## 2013-05-11 DIAGNOSIS — R109 Unspecified abdominal pain: Secondary | ICD-10-CM

## 2013-05-11 DIAGNOSIS — Z8679 Personal history of other diseases of the circulatory system: Secondary | ICD-10-CM | POA: Insufficient documentation

## 2013-05-11 DIAGNOSIS — I10 Essential (primary) hypertension: Secondary | ICD-10-CM | POA: Insufficient documentation

## 2013-05-11 DIAGNOSIS — Z8619 Personal history of other infectious and parasitic diseases: Secondary | ICD-10-CM | POA: Insufficient documentation

## 2013-05-11 DIAGNOSIS — Z79899 Other long term (current) drug therapy: Secondary | ICD-10-CM | POA: Insufficient documentation

## 2013-05-11 LAB — CBC WITH DIFFERENTIAL/PLATELET
BASOS PCT: 0 % (ref 0–1)
Basophils Absolute: 0 10*3/uL (ref 0.0–0.1)
Eosinophils Absolute: 0.2 10*3/uL (ref 0.0–0.7)
Eosinophils Relative: 1 % (ref 0–5)
HCT: 40.1 % (ref 36.0–46.0)
HEMOGLOBIN: 14.2 g/dL (ref 12.0–15.0)
LYMPHS ABS: 1.2 10*3/uL (ref 0.7–4.0)
LYMPHS PCT: 9 % — AB (ref 12–46)
MCH: 30.9 pg (ref 26.0–34.0)
MCHC: 35.4 g/dL (ref 30.0–36.0)
MCV: 87.4 fL (ref 78.0–100.0)
MONOS PCT: 6 % (ref 3–12)
Monocytes Absolute: 0.7 10*3/uL (ref 0.1–1.0)
NEUTROS ABS: 11.1 10*3/uL — AB (ref 1.7–7.7)
NEUTROS PCT: 84 % — AB (ref 43–77)
Platelets: 222 10*3/uL (ref 150–400)
RBC: 4.59 MIL/uL (ref 3.87–5.11)
RDW: 12.2 % (ref 11.5–15.5)
WBC: 13.2 10*3/uL — AB (ref 4.0–10.5)

## 2013-05-11 LAB — URINALYSIS, ROUTINE W REFLEX MICROSCOPIC
Bilirubin Urine: NEGATIVE
GLUCOSE, UA: NEGATIVE mg/dL
Hgb urine dipstick: NEGATIVE
Ketones, ur: NEGATIVE mg/dL
Nitrite: NEGATIVE
PH: 6 (ref 5.0–8.0)
Protein, ur: NEGATIVE mg/dL
SPECIFIC GRAVITY, URINE: 1.013 (ref 1.005–1.030)
Urobilinogen, UA: 0.2 mg/dL (ref 0.0–1.0)

## 2013-05-11 LAB — COMPREHENSIVE METABOLIC PANEL
ALBUMIN: 4.1 g/dL (ref 3.5–5.2)
ALK PHOS: 46 U/L (ref 39–117)
ALT: 21 U/L (ref 0–35)
AST: 19 U/L (ref 0–37)
BUN: 10 mg/dL (ref 6–23)
CHLORIDE: 101 meq/L (ref 96–112)
CO2: 24 meq/L (ref 19–32)
Calcium: 9.3 mg/dL (ref 8.4–10.5)
Creatinine, Ser: 0.72 mg/dL (ref 0.50–1.10)
GFR calc Af Amer: >90 mL/min (ref >90)
GFR calc non Af Amer: >90 mL/min (ref >90)
Glucose, Bld: 132 mg/dL — ABNORMAL HIGH (ref 70–99)
POTASSIUM: 4.2 meq/L (ref 3.7–5.3)
Sodium: 139 mEq/L (ref 137–147)
Total Protein: 7.5 g/dL (ref 6.0–8.3)

## 2013-05-11 LAB — URINE MICROSCOPIC-ADD ON

## 2013-05-11 LAB — POCT PREGNANCY, URINE: PREG TEST UR: NEGATIVE

## 2013-05-11 MED ORDER — ONDANSETRON 4 MG PO TBDP
8.0000 mg | ORAL_TABLET | Freq: Once | ORAL | Status: AC
  Administered 2013-05-11: 8 mg via ORAL
  Filled 2013-05-11: qty 2

## 2013-05-11 MED ORDER — OXYCODONE-ACETAMINOPHEN 5-325 MG PO TABS
1.0000 | ORAL_TABLET | Freq: Four times a day (QID) | ORAL | Status: DC | PRN
Start: 2013-05-11 — End: 2017-02-24

## 2013-05-11 MED ORDER — OXYCODONE-ACETAMINOPHEN 5-325 MG PO TABS
1.0000 | ORAL_TABLET | Freq: Once | ORAL | Status: AC
  Administered 2013-05-11: 1 via ORAL
  Filled 2013-05-11: qty 1

## 2013-05-11 NOTE — ED Provider Notes (Signed)
CSN: [id number]     Arrival date & time 05/11/13  0444 History   First MD Initiated Contact with Patient 05/11/13 0601     Chief Complaint  Patient presents with  . Abdominal Pain   (Consider location/radiation/quality/duration/timing/severity/associated sxs/prior Treatment) HPI Comments: Patient with history of uterine fibroids, no abdominal surgeries -- presents with acute onset of abdominal pain at approximately 3 AM. Pain awoke her from sleep. Patient describes the pain as being sharp and "warm". The pain radiated to her left lower back. Abdominal pain was periumbilical. It was associated with nausea but no vomiting. No fever, urinary symptoms including dysuria or hematuria. No vaginal discharge. Last menstrual period was one week ago and was unremarkable per the patient. She has an IUD in place. Onset of symptoms acute. Course is improving after receiving Percocet in emergency department. Nothing makes symptoms worse. Treated at home with ibuprofen with minimal relief.  Patient is a 38 y.o. female presenting with abdominal pain. The history is provided by the patient.  Abdominal Pain Associated symptoms: nausea   Associated symptoms: no chest pain, no cough, no diarrhea, no dysuria, no fever, no sore throat and no vomiting     Past Medical History  Diagnosis Date  . Dysrhythmia     flutter  . H/O varicella   . S/P cesarean section - arrest of descent (9/30) 02/08/2012  . Postpartum care following cesarean delivery 02/08/2012  . Acute blood loss anemia 02/08/2012  . Hypertension     labetolol  . Chronic hypertension complicating or reason for care during pregnancy 02/08/2012  . Hyperlipidemia   . Acne   . Depression    Past Surgical History  Procedure Laterality Date  . 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 Cesarean section  02/07/2012    Procedure: CESAREAN SECTION;  Surgeon: Serita KyleSheronette A Cousins, MD;  Location: [facility] ORS;  Service: Obstetrics;  Laterality: N/A;  Primary  cesarean section with delivery at  boy at  [id number].  Apgars 8/9.   Family History  Problem Relation Age of Onset  . Hypertension Mother   . Cancer Father     colon, prostate  . Hypertension Father   . Diabetes Father   . Seizures Maternal Uncle    History  Substance Use Topics  . Smoking status: Light Tobacco Smoker  . Smokeless tobacco: Never Used  . Alcohol Use: No     Comment: none with pregnancy   OB History   Grav Para Term Preterm Abortions TAB SAB Ect Mult Living   1 1 1  0 0 0 0 0 0 1     Review of Systems  Constitutional: Negative for fever.  HENT: Negative for rhinorrhea and sore throat.   Eyes: Negative for redness.  Respiratory: Negative for cough.   Cardiovascular: Negative for chest pain.  Gastrointestinal: Positive for nausea and abdominal pain. Negative for vomiting and diarrhea.  Genitourinary: Negative for dysuria.  Musculoskeletal: Positive for back pain. Negative for myalgias.  Skin: Negative for rash.  Neurological: Negative for headaches.    Allergies  Review of patient's allergies indicates no known allergies.  Home Medications   Current Outpatient Rx  Name  Route  Sig  Dispense  Refill  . amLODipine (NORVASC) 5 MG tablet   Oral   Take 1 tablet by mouth daily.         Marland Kitchen. labetalol (NORMODYNE) 100 MG tablet   Oral   Take 100 mg by  mouth 2 (two) times daily.          BP 144/104  Pulse 82  Temp(Src) 98.2 F (36.8 C) (Oral)  Resp 18  SpO2 100% Physical Exam  Nursing note and vitals reviewed. Constitutional: She appears well-developed and well-nourished.  HENT:  Head: Normocephalic and atraumatic.  Eyes: Conjunctivae are normal. Right eye exhibits no discharge. Left eye exhibits no discharge.  Neck: Normal range of motion. Neck supple.  Cardiovascular: Normal rate, regular rhythm and normal heart sounds.   Pulmonary/Chest: Effort normal and breath sounds normal.  Abdominal: Soft. Bowel sounds are normal. She exhibits no distension.  There is no tenderness. There is no rebound and no guarding.  No pain at time of exam.  Neurological: She is alert.  Skin: Skin is warm and dry.  Psychiatric: She has a normal mood and affect.    ED Course  Procedures (including critical care time) Labs Review Labs Reviewed  URINALYSIS, ROUTINE W REFLEX MICROSCOPIC - Abnormal; Notable for the following:    APPearance CLOUDY (*)    Leukocytes, UA SMALL (*)    All other components within normal limits  CBC WITH DIFFERENTIAL - Abnormal; Notable for the following:    WBC 13.2 (*)    Neutrophils Relative % 84 (*)    Neutro Abs 11.1 (*)    Lymphocytes Relative 9 (*)    All other components within normal limits  COMPREHENSIVE METABOLIC PANEL - Abnormal; Notable for the following:    Glucose, Bld 132 (*)    Total Bilirubin <0.2 (*)    All other components within normal limits  URINE MICROSCOPIC-ADD ON - Abnormal; Notable for the following:    Squamous Epithelial / LPF MANY (*)    Bacteria, UA FEW (*)    All other components within normal limits  URINE CULTURE  POCT PREGNANCY, URINE   Imaging Review No results found.  EKG Interpretation    Date/Time:  Friday May 11 2013 05:07:05 EST Ventricular Rate:  76 PR Interval:  150 QRS Duration: 81 QT Interval:  423 QTC Calculation: 476 R Axis:   60 Text Interpretation:  Sinus rhythm Anteroseptal infarct, age indeterminate t wave inversions noted in anterior leads No old tracing to compare Confirmed by [facility]  MD, DONALD [phone]) on 05/11/2013 5:14:06 AM           6:13 AM Patient seen and examined. Work-up initiated. Will check labs, patient currently asymptomatic. Pain does not seem to be lower than umbilicus and pelvic etiology seems unlikely.    Vital signs reviewed and are as follows: Filed Vitals:   05/11/13 0509  BP: 144/104  Pulse: 82  Temp:   Resp: 18   BP 144/104  Pulse 82  Temp(Src) 98.2 F (36.8 C) (Oral)  Resp 18  SpO2 100%  7:59 AM Patient discussed  with Dr. Bebe Shaggy. Labs reviewed with patient. She continues to be pain free.   At this point, do not feel any imaging is necessary and patient can monitor at home. Discussed with patient and she is comfortable with plan.   The patient was urged to return to the Emergency Department immediately with worsening of current symptoms, worsening abdominal pain, persistent vomiting, blood noted in stools, fever, or any other concerns. The patient verbalized understanding.   Patient counseled on use of narcotic pain medications. Counseled not to combine these medications with others containing tylenol. Urged not to drink alcohol, drive, or perform any other activities that requires focus while taking these medications. The  patient verbalizes understanding and agrees with the plan.  MDM   1. Abdominal pain    Patient with intermittent abdominal pain, now resolved completely.  Vitals are stable, no fever.  No signs of dehydration, tolerating PO's.  Lungs are clear.  No focal abdominal pain, no concern for appendicitis, cholecystitis, pancreatitis, ruptured viscus, UTI, kidney stone, or any other abdominal etiology.  Supportive therapy indicated with return if symptoms worsen.  Labs are reassuring, non-specific elevation WBC count. Do not feel imaging indicated at this time given current condition. She seems reliable to return with changing or worsening condition. Patient counseled.     Renne Crigler, PA-C 05/11/13 [id number]

## 2013-05-11 NOTE — ED Notes (Signed)
Pt states she woke up at 0300 from her sleep this morning with severe abdominal pain in her mid lower abdomen, pt states the pain was initially a burning sensation. Pt states the pain is now intermittent, coming and going and radiated to her lower left side of her back. However when the pain comes back it is very severe. Pt states she was told she had a cyst in Sept 2013 during her pregnancy but was informed it should go away on its own, pt has not followed up with an ultrasound. Pt states she has also started having pain in her upper right leg when she has her menstrual cycle (unsure if this is related or not but wanted to mention it). Pt states her last menstrual cycle was normal and two weeks ago. Pt states she has an IUD and was suppose to see her gynecologist this past month but missed the appt and has not been able to rescheduled yet. Pt is A&O X4.

## 2013-05-11 NOTE — ED Notes (Signed)
Duplicate order discontinued.  

## 2013-05-11 NOTE — Discharge Instructions (Signed)
Please read and follow all provided instructions.  Your diagnoses today include:  1. Abdominal pain     Tests performed today include:  Blood counts and electrolytes - slightly high white blood cell count, otherwise normal  Blood tests to check liver and kidney function - normal  Urine test to look for infection and pregnancy (in women) - normal  Vital signs. See below for your results today.   Medications prescribed:   Percocet (oxycodone/acetaminophen) - narcotic pain medication  DO NOT drive or perform any activities that require you to be awake and alert because this medicine can make you drowsy. BE VERY CAREFUL not to take multiple medicines containing Tylenol (also called acetaminophen). Doing so can lead to an overdose which can damage your liver and cause liver failure and possibly death.  Take any prescribed medications only as directed.  Home care instructions:   Follow any educational materials contained in this packet.  Follow-up instructions: Please follow-up with your primary care provider in the next 2 days for further evaluation of your symptoms. If you do not have a primary care doctor -- see below for referral information.   Return instructions:  SEEK IMMEDIATE MEDICAL ATTENTION IF:  The pain does not go away or becomes severe   A temperature above 101F develops   Repeated vomiting occurs (multiple episodes)   The pain becomes localized to portions of the abdomen. The right side could possibly be appendicitis. In an adult, the left lower portion of the abdomen could be colitis or diverticulitis.   Blood is being passed in stools or vomit (bright red or black tarry stools)   You develop chest pain, difficulty breathing, dizziness or fainting, or become confused, poorly responsive, or inconsolable (young children)  If you have any other emergent concerns regarding your health  Additional Information: Abdominal (belly) pain can be caused by many things.  Your caregiver performed an examination and possibly ordered blood/urine tests and imaging (CT scan, x-rays, ultrasound). Many cases can be observed and treated at home after initial evaluation in the emergency department. Even though you are being discharged home, abdominal pain can be unpredictable. Therefore, you need a repeated exam if your pain does not resolve, returns, or worsens. Most patients with abdominal pain don't have to be admitted to the hospital or have surgery, but serious problems like appendicitis and gallbladder attacks can start out as nonspecific pain. Many abdominal conditions cannot be diagnosed in one visit, so follow-up evaluations are very important.  Your vital signs today were: BP 131/88   Pulse 62   Temp(Src) 98.2 F (36.8 C) (Oral)   Resp 14   SpO2 96% If your blood pressure (bp) was elevated above 135/85 this visit, please have this repeated by your doctor within one month. --------------

## 2013-05-12 LAB — URINE CULTURE

## 2013-05-12 NOTE — ED Provider Notes (Signed)
Medical screening examination/treatment/procedure(s) were performed by non-physician practitioner and as supervising physician I was immediately available for consultation/collaboration.  EKG Interpretation    Date/Time:  Friday May 11 2013 05:07:05 EST Ventricular Rate:  76 PR Interval:  150 QRS Duration: 81 QT Interval:  423 QTC Calculation: 476 R Axis:   60 Text Interpretation:  Sinus rhythm Anteroseptal infarct, age indeterminate t wave inversions noted in anterior leads No old tracing to compare Confirmed by Bebe ShaggyWICKLINE  MD, Abdoul Encinas [phone]) on 05/11/2013 5:14:06 AM              Joya Gaskinsonald W Gohan Collister, MD 05/12/13 1325

## 2014-03-11 ENCOUNTER — Encounter (HOSPITAL_COMMUNITY): Payer: Self-pay | Admitting: Emergency Medicine

## 2014-11-04 ENCOUNTER — Ambulatory Visit (INDEPENDENT_AMBULATORY_CARE_PROVIDER_SITE_OTHER): Payer: BLUE CROSS/BLUE SHIELD | Admitting: Licensed Clinical Social Worker

## 2014-11-04 DIAGNOSIS — F332 Major depressive disorder, recurrent severe without psychotic features: Secondary | ICD-10-CM | POA: Diagnosis not present

## 2014-11-22 ENCOUNTER — Ambulatory Visit (INDEPENDENT_AMBULATORY_CARE_PROVIDER_SITE_OTHER): Payer: BLUE CROSS/BLUE SHIELD | Admitting: Licensed Clinical Social Worker

## 2014-11-22 DIAGNOSIS — F332 Major depressive disorder, recurrent severe without psychotic features: Secondary | ICD-10-CM | POA: Diagnosis not present

## 2014-12-20 ENCOUNTER — Ambulatory Visit: Payer: BLUE CROSS/BLUE SHIELD | Admitting: Licensed Clinical Social Worker

## 2015-01-10 ENCOUNTER — Emergency Department (HOSPITAL_COMMUNITY)
Admission: EM | Admit: 2015-01-10 | Discharge: 2015-01-10 | Disposition: A | Discharge status: Home / Self Care | Payer: BLUE CROSS/BLUE SHIELD | Attending: Emergency Medicine | Admitting: Emergency Medicine

## 2015-01-10 ENCOUNTER — Encounter (HOSPITAL_COMMUNITY): Payer: Self-pay | Admitting: Family Medicine

## 2015-01-10 ENCOUNTER — Emergency Department (HOSPITAL_COMMUNITY): Payer: BLUE CROSS/BLUE SHIELD

## 2015-01-10 DIAGNOSIS — Z8639 Personal history of other endocrine, nutritional and metabolic disease: Secondary | ICD-10-CM | POA: Diagnosis not present

## 2015-01-10 DIAGNOSIS — I10 Essential (primary) hypertension: Secondary | ICD-10-CM | POA: Insufficient documentation

## 2015-01-10 DIAGNOSIS — F329 Major depressive disorder, single episode, unspecified: Secondary | ICD-10-CM | POA: Diagnosis not present

## 2015-01-10 DIAGNOSIS — Z720 Tobacco use: Secondary | ICD-10-CM | POA: Insufficient documentation

## 2015-01-10 DIAGNOSIS — R0602 Shortness of breath: Secondary | ICD-10-CM | POA: Diagnosis not present

## 2015-01-10 DIAGNOSIS — Y9389 Activity, other specified: Secondary | ICD-10-CM | POA: Insufficient documentation

## 2015-01-10 DIAGNOSIS — S6991XA Unspecified injury of right wrist, hand and finger(s), initial encounter: Secondary | ICD-10-CM | POA: Insufficient documentation

## 2015-01-10 DIAGNOSIS — Z79899 Other long term (current) drug therapy: Secondary | ICD-10-CM | POA: Diagnosis not present

## 2015-01-10 DIAGNOSIS — Y92410 Unspecified street and highway as the place of occurrence of the external cause: Secondary | ICD-10-CM | POA: Insufficient documentation

## 2015-01-10 DIAGNOSIS — M79644 Pain in right finger(s): Secondary | ICD-10-CM

## 2015-01-10 DIAGNOSIS — Z862 Personal history of diseases of the blood and blood-forming organs and certain disorders involving the immune mechanism: Secondary | ICD-10-CM | POA: Diagnosis not present

## 2015-01-10 DIAGNOSIS — Y998 Other external cause status: Secondary | ICD-10-CM | POA: Diagnosis not present

## 2015-01-10 DIAGNOSIS — Z8619 Personal history of other infectious and parasitic diseases: Secondary | ICD-10-CM | POA: Diagnosis not present
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
## 2015-01-10 NOTE — Discharge Instructions (Signed)
-   Ibuprofen for thumb pain - Return to ED if you develop severe headache, increasing pain in any part of your body, chest pain, palpitations, SOB, passing out, numbness or weakness in your extremities or further worsening of symptoms
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
## 2016-08-05 ENCOUNTER — Encounter: Payer: Self-pay | Admitting: Podiatry

## 2016-08-05 ENCOUNTER — Ambulatory Visit (INDEPENDENT_AMBULATORY_CARE_PROVIDER_SITE_OTHER): Payer: BLUE CROSS/BLUE SHIELD | Admitting: Podiatry

## 2016-08-05 VITALS — BP 125/77 | HR 80 | Resp 18

## 2016-08-05 DIAGNOSIS — B351 Tinea unguium: Secondary | ICD-10-CM

## 2016-08-05 NOTE — Progress Notes (Signed)
   Subjective:    Patient ID: Kristen Thornton, female    DOB: Mar 11, 1976, 41 y.o.   MRN: [id number]  HPI  41 year old female presents the also concerns for left big toenail becoming thick and discolored. She denies any pain to the area. She denies any redness or drainage or any swelling. She said no recent treatment for this. She is [redacted] weeks pregnant.   Review of Systems  All other systems reviewed and are negative.      Objective:   Physical Exam  General: AAO x3, NAD  Dermatological: Left hallux toenail is hypertrophic, dystrophic, discolored, brittle. There is yellow-brown discoloration of the toenail. There is no surrounding redness, drainage or any swelling. This for signs of infection. there is no pain the toenail.  Vascular: DP/PT pulses 2/4, CRT less than 3 seconds. There is no pain with calf compression, swelling, warmth, erythema.   Neruologic: Grossly intact via light touch bilateral. Vibratory intact via tuning fork bilateral. Protective threshold with Semmes Wienstein monofilament intact to all pedal sites bilateral.   Musculoskeletal: No gross boney pedal deformities bilateral. No pain, crepitus, or limitation noted with foot and ankle range of motion bilateral. Muscular strength 5/5 in all groups tested bilateral.  Gait: Unassisted, Nonantalgic.      Assessment & Plan:   41 year old female but hallux onychodystrophy likely onychomycosis  -Treatment options discussed including all alternatives, risks, and complications -Etiology of symptoms were discussed -I [facility] today [facility]. I discussed treatment options for onychomycosis. As she is pregnant discussed laser treatment she'll likely proceed with this pending nail culture result. We will call her with the result comes back and encouraged to call any questions or concerns in the meantime.   Ovid CurdMatthew Wagoner, DPM

## 2016-08-05 NOTE — Patient Instructions (Signed)

## 2016-08-06 NOTE — Addendum Note (Signed)
Addended by: Hadley Pen R on: 08/06/2016 07:20 AM   Modules accepted: Orders

## 2016-08-17 ENCOUNTER — Telehealth: Payer: Self-pay | Admitting: *Deleted

## 2016-08-17 NOTE — Telephone Encounter (Addendum)
-----   Message from Vivi Barrack, DPM sent at 08/17/2016  2:27 PM EDT ----- + fungus not but a typical one. She would benefit more from oral treatment but as she is pregnant we need to hold off. We can try laser but not typically as successful. If she wishes to do laser please have her scheduled with [facility] to start. She can also try a urea cream to help thin the nail some. I spoke with pt's husband and he said pt had just left to walk with their son and would be back later. I told him I would call again tomorrow.08/20/2016-Left message to call for results.08/30/2016-Pt states she has received a call for the results of labs and to call back. I informed pt of Dr. Gabriel Rung review of results and recommendations. Pt states she would like to schedule for laser, but would like to do so closer to June, I transferred pt to schedulers.

## 2016-08-20 NOTE — Telephone Encounter (Deleted)
-----   Message from Vivi Barrack, DPM sent at 08/17/2016  2:27 PM EDT ----- + fungus not but a typical one. She would benefit more from oral treatment but as she is pregnant we need to hold off. We can try laser but not typically as successful. If she wishes to do laser please have her scheduled with [facility] to start. She can also try a urea cream to help thin the nail some.

## 2016-08-31 ENCOUNTER — Other Ambulatory Visit (HOSPITAL_COMMUNITY): Payer: Self-pay | Admitting: Obstetrics and Gynecology

## 2016-08-31 DIAGNOSIS — O289 Unspecified abnormal findings on antenatal screening of mother: Secondary | ICD-10-CM

## 2016-08-31 DIAGNOSIS — O09521 Supervision of elderly multigravida, first trimester: Secondary | ICD-10-CM

## 2016-09-01 ENCOUNTER — Ambulatory Visit (HOSPITAL_COMMUNITY)
Admission: RE | Admit: 2016-09-01 | Discharge: 2016-09-01 | Disposition: A | Discharge status: Home / Self Care | Payer: BLUE CROSS/BLUE SHIELD | Source: Ambulatory Visit | Attending: Obstetrics and Gynecology | Admitting: Obstetrics and Gynecology

## 2016-09-01 DIAGNOSIS — O285 Abnormal chromosomal and genetic finding on antenatal screening of mother: Secondary | ICD-10-CM | POA: Diagnosis present

## 2016-09-01 DIAGNOSIS — Z3A12 12 weeks gestation of pregnancy: Secondary | ICD-10-CM | POA: Insufficient documentation

## 2016-09-01 DIAGNOSIS — O09521 Supervision of elderly multigravida, first trimester: Secondary | ICD-10-CM | POA: Insufficient documentation

## 2016-09-01 DIAGNOSIS — O289 Unspecified abnormal findings on antenatal screening of mother: Secondary | ICD-10-CM | POA: Insufficient documentation

## 2016-09-01 NOTE — Progress Notes (Signed)
DOB: 15-Sep-1975 Referring Provider: Maxie Better, MD Appointment Date: 09/01/2016 Attending: Dr. Derinda Thornton  [name] and her husband, [name], were seen for genetic counseling regarding a maternal age of 41 y.o. and an increased risk for Trisomy 18/ Trisomy 13 based on an abnormal first trimester screen.   In summary:  Discussed AMA and associated risk for fetal aneuploidy  Reviewed results of screening  Down syndrome risk (less than 1 in 69629)  Trisomy 18/13 risk 1/7  Discussed options for additional screening  NIPS  Ultrasound  Discussed diagnostic testing options  CVS  Amniocentesis  Reviewed family history concerns - none reported  Discussed carrier screening options - drawn today  CF  SMA  Hemoglobinopathies  They were counseled regarding maternal age and the association with risk for chromosome conditions due to nondisjunction with aging of the ova.   We reviewed chromosomes, nondisjunction, and the associated 1 in 20 risk for fetal aneuploidy related to a maternal age of 41 y.o. at 25 weeks 5 days gestation.  They were counseled that the risk for aneuploidy decreases as gestational age increases, accounting for those pregnancies which spontaneously abort.  We specifically discussed Down syndrome (trisomy 69), trisomies 52 and 54, and sex chromosome aneuploidies (47,XXX and 47,XXY) including the common features and prognoses of each.   We also reviewed [name]'s First trimester screen result and the associated increase in risk for fetal Trisomy 18/13 (1 in 210 to 1 in 7).  They were counseled regarding other explanations for a screen positive result including normal variation and differences in maternal metabolism.  In addition, we reviewed the screen adjusted reduction in risks for Down syndrome (1 in 130 to 1 in 52841).  They understand that First screen provides a pregnancy specific risk for Trisomy 41 and 13, but is not considered  to be diagnostic.    We reviewed other available screening options including noninvasive prenatal screening (NIPS)/cell free DNA (cfDNA) screening and detailed ultrasound.  They were counseled that screening tests are used to modify a patient's a priori risk for aneuploidy, typically based on age. This estimate provides a pregnancy specific risk assessment. We reviewed the benefits and limitations of each option. Specifically, we discussed the conditions for which each test screens, the detection rates, and false positive rates of each. They were also counseled regarding diagnostic testing via CVS or amniocentesis. We reviewed the approximate 1 in 100 risk for complications with CVS and the 1 in 300-500 risk for complications for amniocentesis, including spontaneous pregnancy loss. We discussed the possible results that the tests might provide including: positive, negative, unanticipated, and no result. Finally, they were counseled regarding the timing and cost of each option and potential out of pocket expenses.  After consideration of all the options, they elected to proceed with NIPS.  Those results will be available in 8-10 days.  If NIPS returns a high risk result, she will likely elect to proceed with amniocentesis.    [name] was provided with written information regarding cystic fibrosis (CF), spinal muscular atrophy (SMA) and hemoglobinopathies including the carrier frequency, availability of carrier screening and prenatal diagnosis if indicated.  In addition, we discussed that all three are routinely screened for as part of the [facility] newborn screening panel.  After further discussion, she elected to pursue screening for CF, SMA and hemoglobinopathies.  Both family histories were reviewed and found to be noncontributory for birth defects, intellectual disability, and known genetic conditions. Without further information regarding  the provided family history, an accurate genetic risk cannot be  calculated. Further genetic counseling is warranted if more information is obtained.  [name] denied exposure to environmental toxins or chemical agents. She denied the use of alcohol, tobacco or street drugs. She denied significant viral illnesses during the course of her pregnancy. Her medical and surgical histories were noncontributory.     I counseled this couple regarding the above risks and available options.  The approximate face-to-face time with the genetic counselor was 60 minutes.    Kristen Gemma, MS,  Certified Genetic Counselor

## 2016-09-02 ENCOUNTER — Ambulatory Visit (HOSPITAL_COMMUNITY): Payer: BLUE CROSS/BLUE SHIELD

## 2016-09-10 ENCOUNTER — Other Ambulatory Visit: Payer: Self-pay

## 2016-09-10 ENCOUNTER — Encounter (HOSPITAL_COMMUNITY): Payer: Self-pay

## 2016-09-13 ENCOUNTER — Telehealth (HOSPITAL_COMMUNITY): Payer: Self-pay | Admitting: Genetics

## 2016-09-13 ENCOUNTER — Other Ambulatory Visit: Payer: Self-pay

## 2016-09-13 NOTE — Telephone Encounter (Signed)
Called Kristen Thornton to discuss her prenatal cell free DNA test results.  Kristen Thornton had Panorama testing through [facility].  Testing was offered because of AMA.   The patient was identified by name and DOB.  We reviewed that these are within normal limits, showing a less than 1 in 10,000 risk for trisomies 21, 18 and 13, and monosomy X (Turner syndrome).  In addition, the risk for triploidy and sex chromosome trisomies (47,XXX and 47,XXY) was also low risk.  We reviewed that this testing identifies > 99% of pregnancies with trisomy 3821, trisomy 6513, sex chromosome trisomies (47,XXX and 47,XXY), and triploidy. The detection rate for trisomy 18 is 96%.  The detection rate for monosomy X is ~92%.  The false positive rate is <0.1% for all conditions. The patient did not wish to know fetal sex.  She understands that this testing does not identify all genetic conditions.  All questions were answered to her satisfaction, she was encouraged to call with additional questions or concerns.  Mady Gemmaaragh Cumi Sanagustin, MS Certified Genetic Counselor

## 2016-09-16 ENCOUNTER — Telehealth (HOSPITAL_COMMUNITY): Payer: Self-pay | Admitting: MS"

## 2016-09-16 NOTE — Telephone Encounter (Signed)
[name] had carrier screening for the ACOG recommended conditions (SMA, CF, and hemoglobinopathies) through [facility]. The results are negative, indicating that she does not have a detectable gene alteration in any of the genes for which analysis was performed. Carrier screening does not detect all carriers of these conditions, but a normal result significantly decreases the likelihood of being a carrier, and therefore, the overall reproductive risk. [facility] sequences most of the genes, which is associated with a high detection rate for carriers, thus a negative screen is very reassuring. Ms. Kristen Thornton accessed these results through [facility]'s online portal on 09/14/16, and thus, is aware of these results.  ? Quinn PlowmanKaren Chistian Kasler, MS Patent attorneyCertified Genetic Counselor

## 2016-10-14 ENCOUNTER — Other Ambulatory Visit (HOSPITAL_COMMUNITY): Payer: Self-pay | Admitting: Obstetrics and Gynecology

## 2016-10-14 DIAGNOSIS — Z3689 Encounter for other specified antenatal screening: Secondary | ICD-10-CM

## 2016-10-14 DIAGNOSIS — Z3A20 20 weeks gestation of pregnancy: Secondary | ICD-10-CM

## 2016-10-14 DIAGNOSIS — O09522 Supervision of elderly multigravida, second trimester: Secondary | ICD-10-CM

## 2016-10-14 DIAGNOSIS — O283 Abnormal ultrasonic finding on antenatal screening of mother: Secondary | ICD-10-CM

## 2016-10-20 ENCOUNTER — Ambulatory Visit: Payer: BLUE CROSS/BLUE SHIELD

## 2016-10-22 ENCOUNTER — Encounter (HOSPITAL_COMMUNITY): Payer: Self-pay | Admitting: *Deleted

## 2016-10-26 ENCOUNTER — Other Ambulatory Visit (HOSPITAL_COMMUNITY): Payer: Self-pay | Admitting: Obstetrics and Gynecology

## 2016-10-26 ENCOUNTER — Ambulatory Visit (HOSPITAL_COMMUNITY)
Admission: RE | Admit: 2016-10-26 | Discharge: 2016-10-26 | Disposition: A | Discharge status: Home / Self Care | Payer: BLUE CROSS/BLUE SHIELD | Source: Ambulatory Visit | Attending: Obstetrics and Gynecology | Admitting: Obstetrics and Gynecology

## 2016-10-26 ENCOUNTER — Encounter (HOSPITAL_COMMUNITY): Payer: Self-pay

## 2016-10-26 DIAGNOSIS — Z3A20 20 weeks gestation of pregnancy: Secondary | ICD-10-CM

## 2016-10-26 DIAGNOSIS — Z3689 Encounter for other specified antenatal screening: Secondary | ICD-10-CM

## 2016-10-26 DIAGNOSIS — O09522 Supervision of elderly multigravida, second trimester: Secondary | ICD-10-CM

## 2016-10-26 DIAGNOSIS — Z363 Encounter for antenatal screening for malformations: Secondary | ICD-10-CM | POA: Insufficient documentation

## 2016-10-26 DIAGNOSIS — O289 Unspecified abnormal findings on antenatal screening of mother: Secondary | ICD-10-CM | POA: Insufficient documentation

## 2016-10-26 DIAGNOSIS — O10019 Pre-existing essential hypertension complicating pregnancy, unspecified trimester: Secondary | ICD-10-CM | POA: Insufficient documentation

## 2016-10-26 DIAGNOSIS — O10012 Pre-existing essential hypertension complicating pregnancy, second trimester: Secondary | ICD-10-CM

## 2016-10-26 DIAGNOSIS — O283 Abnormal ultrasonic finding on antenatal screening of mother: Secondary | ICD-10-CM

## 2016-10-26 NOTE — Addendum Note (Signed)
Encounter addended by: Reita ChardPaschal, Raechell Singleton R, RN on: 10/26/2016  4:19 PM<BR>    Actions taken: Charge Capture section accepted

## 2016-10-26 NOTE — Addendum Note (Signed)
Encounter addended by: Lenoard AdenJohnson, Parveen Freehling M, RT on: 10/26/2016  4:10 PM<BR>    Actions taken: Imaging Exam ended

## 2016-10-28 ENCOUNTER — Ambulatory Visit: Payer: BLUE CROSS/BLUE SHIELD

## 2016-11-11 ENCOUNTER — Ambulatory Visit: Payer: Self-pay | Admitting: Podiatry

## 2016-11-11 DIAGNOSIS — B351 Tinea unguium: Secondary | ICD-10-CM

## 2016-11-11 NOTE — Progress Notes (Signed)
Pt presents with mycotic infection of nails bilateral hallux, and other nails prophylatic  All other systems are negative  Laser therapy administered to affected nails and tolerated well. All safety precautions were in place

## 2016-11-24 ENCOUNTER — Encounter (HOSPITAL_COMMUNITY): Payer: Self-pay

## 2016-11-24 ENCOUNTER — Other Ambulatory Visit (HOSPITAL_COMMUNITY): Payer: Self-pay

## 2016-12-23 ENCOUNTER — Ambulatory Visit: Payer: BLUE CROSS/BLUE SHIELD

## 2017-01-06 ENCOUNTER — Ambulatory Visit: Payer: Self-pay | Admitting: Podiatry

## 2017-01-06 DIAGNOSIS — B351 Tinea unguium: Secondary | ICD-10-CM

## 2017-01-06 NOTE — Progress Notes (Signed)
Pt presents with mycotic infection of nails bilateral hallux, and other nails prophylatic  All other systems are negative  Laser therapy administered to affected nails and tolerated well. All safety precautions were in place. Re-appointed in 4 weeks for 3rd treatment

## 2017-01-20 ENCOUNTER — Other Ambulatory Visit: Payer: Self-pay | Admitting: Obstetrics and Gynecology

## 2017-02-03 ENCOUNTER — Ambulatory Visit: Payer: BLUE CROSS/BLUE SHIELD

## 2017-02-24 ENCOUNTER — Encounter (HOSPITAL_COMMUNITY)
Admission: AD | Disposition: A | Discharge status: Home / Self Care | Payer: Self-pay | Source: Ambulatory Visit | Attending: Obstetrics and Gynecology

## 2017-02-24 ENCOUNTER — Inpatient Hospital Stay (HOSPITAL_COMMUNITY): Payer: BLUE CROSS/BLUE SHIELD | Admitting: Anesthesiology

## 2017-02-24 ENCOUNTER — Encounter (HOSPITAL_COMMUNITY): Payer: Self-pay

## 2017-02-24 ENCOUNTER — Inpatient Hospital Stay (HOSPITAL_COMMUNITY)
Admission: AD | Admit: 2017-02-24 | Discharge: 2017-02-27 | DRG: 788 | Disposition: A | Discharge status: Home / Self Care | Payer: BLUE CROSS/BLUE SHIELD | Source: Ambulatory Visit | Attending: Obstetrics and Gynecology | Admitting: Obstetrics and Gynecology

## 2017-02-24 DIAGNOSIS — F17200 Nicotine dependence, unspecified, uncomplicated: Secondary | ICD-10-CM | POA: Diagnosis present

## 2017-02-24 DIAGNOSIS — O403XX0 Polyhydramnios, third trimester, not applicable or unspecified: Secondary | ICD-10-CM | POA: Diagnosis present

## 2017-02-24 DIAGNOSIS — O365930 Maternal care for other known or suspected poor fetal growth, third trimester, not applicable or unspecified: Secondary | ICD-10-CM | POA: Diagnosis present

## 2017-02-24 DIAGNOSIS — O99334 Smoking (tobacco) complicating childbirth: Secondary | ICD-10-CM | POA: Diagnosis present

## 2017-02-24 DIAGNOSIS — O368331 Maternal care for abnormalities of the fetal heart rate or rhythm, third trimester, fetus 1: Secondary | ICD-10-CM

## 2017-02-24 DIAGNOSIS — O1002 Pre-existing essential hypertension complicating childbirth: Principal | ICD-10-CM | POA: Diagnosis present

## 2017-02-24 DIAGNOSIS — Z3A38 38 weeks gestation of pregnancy: Secondary | ICD-10-CM | POA: Diagnosis not present

## 2017-02-24 DIAGNOSIS — O10919 Unspecified pre-existing hypertension complicating pregnancy, unspecified trimester: Secondary | ICD-10-CM | POA: Diagnosis present

## 2017-02-24 DIAGNOSIS — O34211 Maternal care for low transverse scar from previous cesarean delivery: Secondary | ICD-10-CM | POA: Diagnosis present

## 2017-02-24 LAB — COMPREHENSIVE METABOLIC PANEL
ALT: 21 U/L (ref 14–54)
AST: 22 U/L (ref 15–41)
Albumin: 3.4 g/dL — ABNORMAL LOW (ref 3.5–5.0)
Alkaline Phosphatase: 111 U/L (ref 38–126)
Anion gap: 8 (ref 5–15)
BUN: 7 mg/dL (ref 6–20)
CO2: 22 mmol/L (ref 22–32)
Calcium: 9.5 mg/dL (ref 8.9–10.3)
Chloride: 106 mmol/L (ref 101–111)
Creatinine, Ser: 0.53 mg/dL (ref 0.44–1.00)
GFR calc Af Amer: >60 mL/min (ref >60)
GFR calc non Af Amer: >60 mL/min (ref >60)
Glucose, Bld: 90 mg/dL (ref 65–99)
Potassium: 3.9 mmol/L (ref 3.5–5.1)
Sodium: 136 mmol/L (ref 135–145)
Total Bilirubin: 0.4 mg/dL (ref 0.3–1.2)
Total Protein: 6.7 g/dL (ref 6.5–8.1)

## 2017-02-24 LAB — TYPE AND SCREEN
ABO/RH(D): O POS
Antibody Screen: NEGATIVE

## 2017-02-24 LAB — CBC
HEMATOCRIT: 36.7 % (ref 36.0–46.0)
HEMOGLOBIN: 12.7 g/dL (ref 12.0–15.0)
MCH: 30.2 pg (ref 26.0–34.0)
MCHC: 34.6 g/dL (ref 30.0–36.0)
MCV: 87.2 fL (ref 78.0–100.0)
PLATELETS: 199 10*3/uL (ref 150–400)
RBC: 4.21 MIL/uL (ref 3.87–5.11)
RDW: 14.3 % (ref 11.5–15.5)
WBC: 10.2 10*3/uL (ref 4.0–10.5)

## 2017-02-24 SURGERY — Surgical Case
Anesthesia: Spinal | Site: Abdomen | Wound class: Clean Contaminated

## 2017-02-24 MED ORDER — ONDANSETRON HCL 4 MG/2ML IJ SOLN
INTRAMUSCULAR | Status: DC | PRN
  Administered 2017-02-24: 4 mg via INTRAVENOUS

## 2017-02-24 MED ORDER — ZOLPIDEM TARTRATE 5 MG PO TABS: 5.0000 mg | ORAL_TABLET | Freq: Every evening | ORAL | Status: DC | PRN

## 2017-02-24 MED ORDER — LACTATED RINGERS IV SOLN
INTRAVENOUS | Status: DC
  Administered 2017-02-24: 22:00:00 via INTRAVENOUS

## 2017-02-24 MED ORDER — SIMETHICONE 80 MG PO CHEW: 80.0000 mg | CHEWABLE_TABLET | ORAL | Status: DC | PRN

## 2017-02-24 MED ORDER — OXYCODONE-ACETAMINOPHEN 5-325 MG PO TABS
1.0000 | ORAL_TABLET | ORAL | Status: DC | PRN
  Administered 2017-02-25 – 2017-02-27 (×5): 1 via ORAL
  Filled 2017-02-24 (×5): qty 1

## 2017-02-24 MED ORDER — FENTANYL CITRATE (PF) 100 MCG/2ML IJ SOLN
INTRAMUSCULAR | Status: AC
  Administered 2017-02-24: 50 ug via INTRAVENOUS
  Filled 2017-02-24: qty 2

## 2017-02-24 MED ORDER — KETOROLAC TROMETHAMINE 30 MG/ML IJ SOLN: 30.0000 mg | Freq: Four times a day (QID) | INTRAMUSCULAR | Status: AC | PRN

## 2017-02-24 MED ORDER — FENTANYL CITRATE (PF) 100 MCG/2ML IJ SOLN
25.0000 ug | INTRAMUSCULAR | Status: DC | PRN
  Administered 2017-02-24 (×2): 50 ug via INTRAVENOUS

## 2017-02-24 MED ORDER — PRENATAL MULTIVITAMIN CH
1.0000 | ORAL_TABLET | Freq: Every day | ORAL | Status: DC
  Administered 2017-02-25 – 2017-02-27 (×3): 1 via ORAL
  Filled 2017-02-24 (×3): qty 1

## 2017-02-24 MED ORDER — IBUPROFEN 600 MG PO TABS
600.0000 mg | ORAL_TABLET | Freq: Four times a day (QID) | ORAL | Status: DC
  Administered 2017-02-25 – 2017-02-27 (×10): 600 mg via ORAL
  Filled 2017-02-24 (×10): qty 1

## 2017-02-24 MED ORDER — MEPERIDINE HCL 25 MG/ML IJ SOLN: 6.2500 mg | INTRAMUSCULAR | Status: DC | PRN

## 2017-02-24 MED ORDER — WITCH HAZEL-GLYCERIN EX PADS: 1.0000 "application " | MEDICATED_PAD | CUTANEOUS | Status: DC | PRN

## 2017-02-24 MED ORDER — OXYTOCIN 10 UNIT/ML IJ SOLN
INTRAMUSCULAR | Status: AC
  Filled 2017-02-24: qty 4

## 2017-02-24 MED ORDER — MORPHINE SULFATE (PF) 0.5 MG/ML IJ SOLN
INTRAMUSCULAR | Status: DC | PRN
  Administered 2017-02-24: .2 mg via INTRATHECAL

## 2017-02-24 MED ORDER — MORPHINE SULFATE (PF) 0.5 MG/ML IJ SOLN
INTRAMUSCULAR | Status: AC
  Filled 2017-02-24: qty 10

## 2017-02-24 MED ORDER — OXYCODONE HCL 5 MG PO TABS: 5.0000 mg | ORAL_TABLET | Freq: Once | ORAL | Status: DC | PRN

## 2017-02-24 MED ORDER — OXYTOCIN 10 UNIT/ML IJ SOLN
INTRAVENOUS | Status: DC | PRN
  Administered 2017-02-24: 40 [IU] via INTRAVENOUS

## 2017-02-24 MED ORDER — PHENYLEPHRINE 8 MG IN D5W 100 ML (0.08MG/ML) PREMIX OPTIME
INJECTION | INTRAVENOUS | Status: AC
  Filled 2017-02-24: qty 100

## 2017-02-24 MED ORDER — DIBUCAINE 1 % RE OINT: 1.0000 "application " | TOPICAL_OINTMENT | RECTAL | Status: DC | PRN

## 2017-02-24 MED ORDER — PHENYLEPHRINE 8 MG IN D5W 100 ML (0.08MG/ML) PREMIX OPTIME
INJECTION | INTRAVENOUS | Status: DC | PRN
  Administered 2017-02-24: 30 ug/min via INTRAVENOUS

## 2017-02-24 MED ORDER — KETOROLAC TROMETHAMINE 30 MG/ML IJ SOLN
INTRAMUSCULAR | Status: AC
  Filled 2017-02-24: qty 1

## 2017-02-24 MED ORDER — SIMETHICONE 80 MG PO CHEW
80.0000 mg | CHEWABLE_TABLET | Freq: Three times a day (TID) | ORAL | Status: DC
  Administered 2017-02-25 – 2017-02-27 (×7): 80 mg via ORAL
  Filled 2017-02-24 (×7): qty 1

## 2017-02-24 MED ORDER — OXYCODONE-ACETAMINOPHEN 5-325 MG PO TABS
2.0000 | ORAL_TABLET | ORAL | Status: DC | PRN
Start: 2017-02-24 — End: 2017-02-27

## 2017-02-24 MED ORDER — BUPIVACAINE IN DEXTROSE 0.75-8.25 % IT SOLN
INTRATHECAL | Status: DC | PRN
  Administered 2017-02-24: 1.8 mg via INTRATHECAL

## 2017-02-24 MED ORDER — BUPIVACAINE HCL (PF) 0.25 % IJ SOLN
INTRAMUSCULAR | Status: AC
  Filled 2017-02-24: qty 30

## 2017-02-24 MED ORDER — FENTANYL CITRATE (PF) 100 MCG/2ML IJ SOLN
INTRAMUSCULAR | Status: AC
  Filled 2017-02-24: qty 2

## 2017-02-24 MED ORDER — STERILE WATER FOR IRRIGATION IR SOLN
Status: DC | PRN
  Administered 2017-02-24: 1000 mL

## 2017-02-24 MED ORDER — DILTIAZEM HCL ER 240 MG PO CP24
240.0000 mg | ORAL_CAPSULE | Freq: Every day | ORAL | Status: DC
  Administered 2017-02-24 – 2017-02-26 (×3): 240 mg via ORAL
  Filled 2017-02-24 (×5): qty 1

## 2017-02-24 MED ORDER — LACTATED RINGERS IV SOLN
INTRAVENOUS | Status: DC
  Administered 2017-02-24 (×3): via INTRAVENOUS

## 2017-02-24 MED ORDER — ONDANSETRON HCL 4 MG/2ML IJ SOLN
INTRAMUSCULAR | Status: AC
  Filled 2017-02-24: qty 2

## 2017-02-24 MED ORDER — SODIUM CHLORIDE 0.9 % IR SOLN
Status: DC | PRN
  Administered 2017-02-24: 1000 mL

## 2017-02-24 MED ORDER — OXYCODONE HCL 5 MG/5ML PO SOLN: 5.0000 mg | Freq: Once | ORAL | Status: DC | PRN

## 2017-02-24 MED ORDER — COCONUT OIL OIL
1.0000 "application " | TOPICAL_OIL | Status: DC | PRN
  Administered 2017-02-25: 1 via TOPICAL
  Filled 2017-02-24: qty 120

## 2017-02-24 MED ORDER — KETOROLAC TROMETHAMINE 30 MG/ML IJ SOLN
30.0000 mg | Freq: Four times a day (QID) | INTRAMUSCULAR | Status: AC | PRN
  Administered 2017-02-24: 30 mg via INTRAMUSCULAR

## 2017-02-24 MED ORDER — CEFAZOLIN SODIUM-DEXTROSE 2-4 GM/100ML-% IV SOLN
2.0000 g | INTRAVENOUS | Status: AC
  Administered 2017-02-24: 2 g via INTRAVENOUS
  Filled 2017-02-24: qty 100

## 2017-02-24 MED ORDER — SOD CITRATE-CITRIC ACID 500-334 MG/5ML PO SOLN
30.0000 mL | Freq: Once | ORAL | Status: AC
  Administered 2017-02-24: 30 mL via ORAL
  Filled 2017-02-24: qty 15

## 2017-02-24 MED ORDER — SIMETHICONE 80 MG PO CHEW
80.0000 mg | CHEWABLE_TABLET | ORAL | Status: DC
  Administered 2017-02-24 – 2017-02-26 (×3): 80 mg via ORAL
  Filled 2017-02-24 (×3): qty 1

## 2017-02-24 MED ORDER — FENTANYL CITRATE (PF) 100 MCG/2ML IJ SOLN
INTRAMUSCULAR | Status: DC | PRN
  Administered 2017-02-24: 90 ug via INTRAVENOUS
  Administered 2017-02-24: 10 ug via INTRATHECAL

## 2017-02-24 MED ORDER — DIPHENHYDRAMINE HCL 25 MG PO CAPS
25.0000 mg | ORAL_CAPSULE | Freq: Four times a day (QID) | ORAL | Status: DC | PRN
  Administered 2017-02-25: 25 mg via ORAL
  Filled 2017-02-24: qty 1

## 2017-02-24 MED ORDER — LABETALOL HCL 200 MG PO TABS
300.0000 mg | ORAL_TABLET | Freq: Two times a day (BID) | ORAL | Status: DC
  Administered 2017-02-24 – 2017-02-27 (×6): 300 mg via ORAL
  Filled 2017-02-24 (×7): qty 1

## 2017-02-24 MED ORDER — MENTHOL 3 MG MT LOZG: 1.0000 | LOZENGE | OROMUCOSAL | Status: DC | PRN

## 2017-02-24 MED ORDER — SENNOSIDES-DOCUSATE SODIUM 8.6-50 MG PO TABS
2.0000 | ORAL_TABLET | ORAL | Status: DC
  Administered 2017-02-24 – 2017-02-26 (×3): 2 via ORAL
  Filled 2017-02-24 (×3): qty 2

## 2017-02-24 MED ORDER — LABETALOL HCL 100 MG PO TABS
100.0000 mg | ORAL_TABLET | Freq: Two times a day (BID) | ORAL | Status: DC
  Administered 2017-02-24 – 2017-02-27 (×6): 100 mg via ORAL
  Filled 2017-02-24 (×7): qty 1

## 2017-02-24 MED ORDER — ONDANSETRON HCL 4 MG/2ML IJ SOLN: 4.0000 mg | Freq: Four times a day (QID) | INTRAMUSCULAR | Status: DC | PRN

## 2017-02-24 MED ORDER — OXYTOCIN 40 UNITS IN LACTATED RINGERS INFUSION - SIMPLE MED: 2.5000 [IU]/h | INTRAVENOUS | Status: AC

## 2017-02-24 SURGICAL SUPPLY — 36 items
BARRIER ADHS 3X4 INTERCEED (GAUZE/BANDAGES/DRESSINGS) ×3 IMPLANT
BENZOIN TINCTURE PRP APPL 2/3 (GAUZE/BANDAGES/DRESSINGS) ×3 IMPLANT
CHLORAPREP W/TINT 26ML (MISCELLANEOUS) ×3 IMPLANT
CLAMP CORD UMBIL (MISCELLANEOUS) ×3 IMPLANT
CLOSURE WOUND 1/2 X4 (GAUZE/BANDAGES/DRESSINGS) ×1
CLOTH BEACON ORANGE TIMEOUT ST (SAFETY) ×3 IMPLANT
DRAPE C SECTION CLR SCREEN (DRAPES) ×3 IMPLANT
DRESSING TELFA 8X3 (GAUZE/BANDAGES/DRESSINGS) ×3 IMPLANT
DRSG OPSITE POSTOP 4X10 (GAUZE/BANDAGES/DRESSINGS) ×3 IMPLANT
ELECT REM PT RETURN 9FT ADLT (ELECTROSURGICAL) ×3
ELECTRODE REM PT RTRN 9FT ADLT (ELECTROSURGICAL) ×1 IMPLANT
EXTRACTOR VACUUM M CUP 4 TUBE (SUCTIONS) ×2 IMPLANT
EXTRACTOR VACUUM M CUP 4' TUBE (SUCTIONS) ×1
GLOVE BIOGEL PI IND STRL 7.0 (GLOVE) ×2 IMPLANT
GLOVE BIOGEL PI INDICATOR 7.0 (GLOVE) ×4
GLOVE ECLIPSE 6.5 STRL STRAW (GLOVE) ×3 IMPLANT
GOWN STRL REUS W/TWL LRG LVL3 (GOWN DISPOSABLE) ×6 IMPLANT
NEEDLE HYPO 22GX1.5 SAFETY (NEEDLE) ×3 IMPLANT
NS IRRIG 1000ML POUR BTL (IV SOLUTION) ×3 IMPLANT
PACK C SECTION WH (CUSTOM PROCEDURE TRAY) ×3 IMPLANT
PAD ABD 8X7 1/2 STERILE (GAUZE/BANDAGES/DRESSINGS) ×3 IMPLANT
PAD OB MATERNITY 4.3X12.25 (PERSONAL CARE ITEMS) ×3 IMPLANT
RTRCTR C-SECT PINK 25CM LRG (MISCELLANEOUS) ×3 IMPLANT
SPONGE GAUZE 4X4 12PLY STER LF (GAUZE/BANDAGES/DRESSINGS) ×6 IMPLANT
SPONGE LAP 18X18 X RAY DECT (DISPOSABLE) ×3 IMPLANT
STRIP CLOSURE SKIN 1/2X4 (GAUZE/BANDAGES/DRESSINGS) ×2 IMPLANT
SUT MNCRL 0 VIOLET CTX 36 (SUTURE) ×3 IMPLANT
SUT MONOCRYL 0 CTX 36 (SUTURE) ×6
SUT PLAIN 2 0 XLH (SUTURE) ×3 IMPLANT
SUT VIC AB 0 CT1 36 (SUTURE) ×6 IMPLANT
SUT VIC AB 2-0 CT1 27 (SUTURE) ×2
SUT VIC AB 2-0 CT1 TAPERPNT 27 (SUTURE) ×1 IMPLANT
SYR CONTROL 10ML LL (SYRINGE) ×3 IMPLANT
TAPE CLOTH SURG 4X10 WHT LF (GAUZE/BANDAGES/DRESSINGS) ×3 IMPLANT
TOWEL OR 17X24 6PK STRL BLUE (TOWEL DISPOSABLE) ×3 IMPLANT
TRAY FOLEY BAG SILVER LF 14FR (SET/KITS/TRAYS/PACK) ×3 IMPLANT

## 2017-02-24 NOTE — Anesthesia Procedure Notes (Signed)
Spinal  Patient location during procedure: OR Start time: 02/24/2017 5:31 PM End time: 02/24/2017 5:34 PM Staffing Anesthesiologist: Chaney MallingHODIERNE, Tamarra Geiselman Performed: anesthesiologist  Preanesthetic Checklist Completed: patient identified, surgical consent, pre-op evaluation, timeout performed, IV checked, risks and benefits discussed and monitors and equipment checked Spinal Block Patient position: sitting Prep: DuraPrep Patient monitoring: cardiac monitor, continuous pulse ox and blood pressure Approach: midline Location: L3-4 Injection technique: single-shot Needle Needle type: Pencan  Needle gauge: 24 G Needle length: 9 cm Assessment Sensory level: T10 Additional Notes Functioning IV was confirmed and monitors were applied. Sterile prep and drape, including hand hygiene and sterile gloves were used. The patient was positioned and the spine was prepped. The skin was anesthetized with lidocaine.  Free flow of clear CSF was obtained prior to injecting local anesthetic into the CSF.  The spinal needle aspirated freely following injection.  The needle was carefully withdrawn.  The patient tolerated the procedure well.

## 2017-02-24 NOTE — Brief Op Note (Signed)
02/24/2017  6:50 PM  PATIENT:  Kristen Thornton  41 y.o. female  PRE-OPERATIVE DIAGNOSIS:  intrauterine growth restriction, chronic hypertension, repeat cesarean section, absent end diastolic flow, IUP @ 38 weeks  POST-OPERATIVE DIAGNOSIS:  intrauterine growth restriction, chronic hypertension, repeat cesarean section, absent end diastolic flow, IUP @ 38 weeks  PROCEDURE:  Repeat Classical  Cesarean section  SURGEON:  Surgeon(s) and Role:    * Maxie Betterousins, Faolan Springfield, MD - Primary  PHYSICIAN ASSISTANT:   ASSISTANTS: Marlinda Mikeanya Bailey, CNM   ANESTHESIA:   spinal Findings: very dextrorotated uterus, floating vertex, nl tubes and ovaries, adherent bladder peritoneum, live female EBL:  855 mL   BLOOD ADMINISTERED:none  DRAINS: none   LOCAL MEDICATIONS USED:  MARCAINE     SPECIMEN:  Source of Specimen:  placenta not sent  DISPOSITION OF SPECIMEN:  N/A  COUNTS:  YES  TOURNIQUET:  * No tourniquets in log *  DICTATION: .Other Dictation: Dictation Number [id number]  PLAN OF CARE: Admit to inpatient   PATIENT DISPOSITION:  PACU - hemodynamically stable.   Delay start of Pharmacological VTE agent (>24hrs) due to surgical blood loss or risk of bleeding: no

## 2017-02-24 NOTE — Anesthesia Postprocedure Evaluation (Signed)
Anesthesia Post Note  Patient: Kristen Thornton  Procedure(s) Performed: REPEAT CESAREAN SECTION (N/A Abdomen)     Patient location during evaluation: PACU Anesthesia Type: Spinal Level of consciousness: oriented and awake and alert Pain management: pain level controlled Vital Signs Assessment: post-procedure vital signs reviewed and stable Respiratory status: spontaneous breathing, respiratory function stable and patient connected to nasal cannula oxygen Cardiovascular status: blood pressure returned to baseline and stable Postop Assessment: no headache, no backache, no apparent nausea or vomiting, spinal receding and patient able to bend at knees Anesthetic complications: no    Last Vitals:  Vitals:   02/24/17 2055 02/24/17 2116  BP: (!) 151/95 (!) 151/95  Pulse: 92 92  Resp: 20   Temp: 37.3 C   SpO2: 100%     Last Pain:  Vitals:   02/24/17 2055  TempSrc: Oral  PainSc: 0-No pain   Pain Goal:                 Cecile HearingStephen Edward Grasiela Jonsson

## 2017-02-24 NOTE — MAU Note (Signed)
Sent from office with BPP 6/10 and absent end diastolic flow. Ate at 11am.

## 2017-02-24 NOTE — Progress Notes (Signed)
Pt seen by me earlier today in the office Due to her chronic HTN she has been having antepartum fetal surveillance She had NR NST sono showed 5lb 12 oz( 8%) with no fetal growth and absent end diastolic flow  BPP 6/8( -2 breathing), AFI 6 Pt was sched for repeat C/S. Pt ate at 11 am  will proceed with C/S due to lack of growth Tracing now: baseline 120 (+) accel to 140 BP 139/82   Pulse 72   Temp 98.2 F (36.8 C) (Oral)   Resp 18   Ht 5\' 4"  (1.626 m)   Wt 101.6 kg (224 lb)   LMP 06/03/2016   BMI 38.45 kg/m   CBC    Component Value Date/Time   WBC 10.2 02/24/2017 1240   RBC 4.21 02/24/2017 1240   HGB 12.7 02/24/2017 1240   HCT 36.7 02/24/2017 1240   PLT 199 02/24/2017 1240   MCV 87.2 02/24/2017 1240   MCH 30.2 02/24/2017 1240   MCHC 34.6 02/24/2017 1240   RDW 14.3 02/24/2017 1240   LYMPHSABS 1.2 05/11/2013 0625   MONOABS 0.7 05/11/2013 0625   EOSABS 0.2 05/11/2013 0625   BASOSABS 0.0 05/11/2013 0625   . IMP: chronic HTN with assoc IUGR IUP @ 38 week P) proceed with C/S. Risk of C/S reviewed including infection, bleeding, poss need for blood transfusion and its risk, injury to surrounding organ structures, all ? Answered. Disc poss nicu admit for baby. Consent signed

## 2017-02-24 NOTE — Transfer of Care (Signed)
Immediate Anesthesia Transfer of Care Note  Patient: Kristen Thornton  Procedure(s) Performed: REPEAT CESAREAN SECTION (N/A Abdomen)  Patient Location: PACU  Anesthesia Type:Spinal  Level of Consciousness: awake, alert  and oriented  Airway & Oxygen Therapy: Patient Spontanous Breathing  Post-op Assessment: Report given to RN and Post -op Vital signs reviewed and stable  Post vital signs: Reviewed and stable  Last Vitals:  Vitals:   02/24/17 1607 02/24/17 1713  BP: (!) 150/89 134/90  Pulse: 80 94  Resp: 18 18  Temp:      Last Pain:  Vitals:   02/24/17 1203  TempSrc: Oral         Complications: No apparent anesthesia complications

## 2017-02-24 NOTE — Progress Notes (Signed)

## 2017-02-24 NOTE — MAU Note (Signed)
Urine in lab 

## 2017-02-24 NOTE — Anesthesia Preprocedure Evaluation (Signed)
Anesthesia Evaluation  Patient identified by MRN, date of birth, ID band Patient awake    Reviewed: Allergy & Precautions, H&P , NPO status , Patient's Chart, lab work & pertinent test results  Airway Mallampati: II   Neck ROM: full    Dental   Pulmonary Current Smoker,    breath sounds clear to auscultation       Cardiovascular hypertension,  Rhythm:regular Rate:Normal     Neuro/Psych PSYCHIATRIC DISORDERS Depression    GI/Hepatic   Endo/Other    Renal/GU      Musculoskeletal   Abdominal   Peds  Hematology   Anesthesia Other Findings   Reproductive/Obstetrics (+) Pregnancy                             Anesthesia Physical Anesthesia Plan  ASA: II  Anesthesia Plan: Spinal   Post-op Pain Management:    Induction: Intravenous  PONV Risk Score and Plan: 1 and Ondansetron, Dexamethasone and Treatment may vary due to age or medical condition  Airway Management Planned: Simple Face Mask  Additional Equipment:   Intra-op Plan:   Post-operative Plan:   Informed Consent: I have reviewed the patients History and Physical, chart, labs and discussed the procedure including the risks, benefits and alternatives for the proposed anesthesia with the patient or authorized representative who has indicated his/her understanding and acceptance.     Plan Discussed with: CRNA, Anesthesiologist and Surgeon  Anesthesia Plan Comments:         Anesthesia Quick Evaluation

## 2017-02-24 NOTE — H&P (Signed)
OB ADMISSION/ HISTORY & PHYSICAL:  Admission Date: 02/24/2017 11:47 AM  Admit Diagnosis: 38.0 weeks with chronic hypertension / poly-hydramnios / abnormal fetal testing (BPP 6/8 and abnormal doppler studies)  Kristen Thornton is a 41 y.o. female presenting for admit due to abnormal fetal testing.  Prenatal History: G2P1001   EDC : 03/10/2017, by Last Menstrual Period  Prenatal care at [facility]  Primary Ob Provider: Cherly Hensen Prenatal course complicated by [facility] / chronic hypertension on two agents (Labetalol and Dilt-XR) / polyhydramnios / previous CS (arrest of labor) / growth at 9% today with BPP 6/8 and abnormal doppler studies  Prenatal Labs: ABO, Rh:  O positive Antibody:  Negative Rubella:   Immune RPR:   NR HBsAg:   negative HIV:   NR GTT: 151 / 3hr-GTT: [phone] GBS:   Negative  Medical / Surgical History :  Past medical history:  Past Medical History:  Diagnosis Date  . Acne   . Acute blood loss anemia 02/08/2012  . Chronic hypertension complicating or reason for care during pregnancy 02/08/2012  . Depression   . Dysrhythmia    flutter  . H/O varicella   . Hyperlipidemia   . Hypertension    labetolol  . Postpartum care following cesarean delivery 02/08/2012  . S/P cesarean section - arrest of descent (9/30) 02/08/2012     Past surgical history:  Past Surgical History:  Procedure Laterality Date  . BLEPHAROPLASTY    . CESAREAN SECTION  02/07/2012   Procedure: CESAREAN SECTION;  Surgeon: Serita Kyle, MD;  Location: [facility];  Service: Obstetrics;  Laterality: N/A;  Primary cesarean section with delivery at  boy at  46.  Apgars 8/9.  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 WISDOM TOOTH EXTRACTION      Family History:  Family History  Problem Relation Age of Onset  . Cancer Father        colon, prostate  . Hypertension Father   . Diabetes Father   . Hypertension Mother   . Seizures Maternal Uncle     Social History:   reports that she has been smoking.  She has never used smokeless tobacco. She reports that she does not drink alcohol or use drugs.  Allergies: Patient has no known allergies.   Current Medications at time of admission:  Prior to Admission medications   Medication Sig Start Date End Date Taking? Authorizing Provider  calcium carbonate (TUMS - DOSED IN MG ELEMENTAL CALCIUM) 500 MG chewable tablet Chew 2 tablets by mouth 2 (two) times daily as needed for indigestion or heartburn.   Yes [provider]  DILT-XR 240 MG 24 hr capsule Take 240 mg by mouth daily.    Yes  Last dose: 4pm 10/17 [provider]  IRON PO Take 1 tablet by mouth daily.   Yes [provider]  labetalol (NORMODYNE) 100 MG tablet Take 100 mg by mouth 2 (two) times daily. Take with 300mg  tablet for complete dose of 400mg  twice daily   Yes  Last dose: 8am 10/18 [provider]  labetalol (NORMODYNE) 300 MG tablet Take 300 mg by mouth 2 (two) times daily. Take with 100mg  tablet for complete dose of 400mg  twice daily   Yes  Last dose: 8am 10/18 [provider]  Prenatal Vit-Fe Fumarate-FA (PRENATAL MULTIVITAMIN) TABS tablet Take 1 tablet by mouth daily at 12 noon.   Yes [provider]   Review of  Systems: Active FM Denies PIH symptoms Ate 2 cookies at 11am  Physical Exam: VS: Blood pressure (!) 148/95, pulse 72, temperature 98.2 F (36.8 C), temperature source Oral, resp. rate 18, height 5\' 4"  (1.626 m), weight 101.6 kg (224 lb), last menstrual period 06/03/2016.  General: alert and oriented, appears calm  Heart: RRR Lungs: Clear lung fields Abdomen: Gravid, soft and non-tender, non-distended / uterus: gravid Extremities: edema  Genitalia / VE:  deferred  FHR: baseline rate 130 / initially variability minimal with late decelerations / moderate variability with small accelerations to 145 after position change and IVF  TOCO: occasional mild  ctx  Assessment: 38.[redacted] weeks gestation Chronic hypertension Abnormal fetal testing Previous cesarean section with planned repeat c-section  Plan:  Admit Labs and NST NPO for cesarean delivery plan Dr Wonda Oldsousin attending physician - evaluated in office and sent to [facility] for admit  Marlinda MikeBAILEY, Destinie Thornsberry CNM, MSN, [facility] SpecialistsFACNM 02/24/2017, 12:41 PM

## 2017-02-25 ENCOUNTER — Encounter (HOSPITAL_COMMUNITY): Payer: Self-pay | Admitting: Obstetrics and Gynecology

## 2017-02-25 LAB — CBC
HCT: 31.8 % — ABNORMAL LOW (ref 36.0–46.0)
HEMOGLOBIN: 10.8 g/dL — AB (ref 12.0–15.0)
MCH: 29.9 pg (ref 26.0–34.0)
MCHC: 34 g/dL (ref 30.0–36.0)
MCV: 88.1 fL (ref 78.0–100.0)
PLATELETS: 164 10*3/uL (ref 150–400)
RBC: 3.61 MIL/uL — AB (ref 3.87–5.11)
RDW: 14.5 % (ref 11.5–15.5)
WBC: 12.5 10*3/uL — AB (ref 4.0–10.5)

## 2017-02-25 LAB — RPR: RPR Ser Ql: NONREACTIVE

## 2017-02-25 MED ORDER — NALBUPHINE HCL 10 MG/ML IJ SOLN: 5.0000 mg | INTRAMUSCULAR | Status: DC | PRN

## 2017-02-25 MED ORDER — NALBUPHINE HCL 10 MG/ML IJ SOLN: 5.0000 mg | Freq: Once | INTRAMUSCULAR | Status: DC | PRN

## 2017-02-25 MED ORDER — DIPHENHYDRAMINE HCL 25 MG PO CAPS: 25.0000 mg | ORAL_CAPSULE | ORAL | Status: DC | PRN

## 2017-02-25 MED ORDER — ONDANSETRON HCL 4 MG/2ML IJ SOLN: 4.0000 mg | Freq: Three times a day (TID) | INTRAMUSCULAR | Status: DC | PRN

## 2017-02-25 MED ORDER — SODIUM CHLORIDE 0.9% FLUSH: 3.0000 mL | INTRAVENOUS | Status: DC | PRN

## 2017-02-25 MED ORDER — NALOXONE HCL 0.4 MG/ML IJ SOLN: 0.4000 mg | INTRAMUSCULAR | Status: DC | PRN

## 2017-02-25 MED ORDER — NALOXONE HCL 2 MG/2ML IJ SOSY
1.0000 ug/kg/h | PREFILLED_SYRINGE | INTRAVENOUS | Status: DC | PRN
  Filled 2017-02-25: qty 2

## 2017-02-25 MED ORDER — SCOPOLAMINE 1 MG/3DAYS TD PT72
1.0000 | MEDICATED_PATCH | Freq: Once | TRANSDERMAL | Status: DC
  Filled 2017-02-25: qty 1

## 2017-02-25 MED ORDER — DIPHENHYDRAMINE HCL 50 MG/ML IJ SOLN: 12.5000 mg | INTRAMUSCULAR | Status: DC | PRN

## 2017-02-25 NOTE — Plan of Care (Signed)
Problem: Pain Managment: Goal: General experience of comfort will improve Outcome: Completed/Met Date Met: 02/25/17 Discussed scheduled and prn pain medications with patient.   Problem: Activity: Goal: Risk for activity intolerance will decrease Discussed the importance of ambulating in hallway three times a day today gradually increasing in distance.   Problem: Activity: Goal: Ability to tolerate increased activity will improve Outcome: Completed/Met Date Met: 02/25/17 Demonstrated and explained to patient how to hand express colostrum and collect into collection bullets.   Problem: Nutritional: Goal: Mothers verbalization of comfort with breastfeeding process will improve Demonstrated and explained to patient how to hand express colostrum and collect into collection bullets.

## 2017-02-25 NOTE — Op Note (Signed)
NAME:  Kristen Thornton, Kristen Thornton                      ACCOUNT NO.:  MEDICAL RECORD NO.:  [id number]  LOCATION:                                 FACILITY:  PHYSICIAN:  Maxie Better, M.D.    DATE OF BIRTH:  DATE OF PROCEDURE:  02/24/2017 DATE OF DISCHARGE:                              OPERATIVE REPORT   PREOPERATIVE DIAGNOSES: 1. Previous cesarean section. 2. Chronic hypertension in third trimester. 3. Third trimester intrauterine growth restriction. 4. Absent end-diastolic flow. 5. Intrauterine gestation at 38 weeks.  PROCEDURES: 1. Repeat cesarean section, Classical hysterotomy.  POSTOPERATIVE DIAGNOSES: 1. Previous cesarean section. 2. Chronic hypertension in third trimester. 3. Third trimester intrauterine growth restriction. 4. Absent end-diastolic flow. 5. Intrauterine gestation at 38 weeks.  ANESTHESIA:  Spinal.  SURGEON:  Maxie Better, M.D.  ASSISTANT:  Marlinda Mike, C.N.M.  DESCRIPTION OF PROCEDURE:  Under adequate spinal anesthesia, the patient was placed in the supine position with a left lateral tilt.  She was sterilely prepped and draped in usual fashion.  An indwelling Foley catheter was sterilely placed.  Marcaine 0.25% was injected along the previous Pfannenstiel skin incision site.  Pfannenstiel skin incision was then made, carried down to the rectus fascia.  The rectus fascia was opened transversely.  The rectus fascia was then bluntly and sharply dissected off the rectus muscle in a superior and inferior fashion.  The rectus muscle was split in the midline with sharp dissection.  The parietal peritoneum was subsequently entered bluntly and extended under direct visualization.  An Alexis self-retaining retractor was then placed.  The uterus was dextrorotated.  The lower uterine segment was somewhat developed, but was narrow.  The vesicouterine peritoneum was attempted to be opened transversely, but there was no descent of the bladder.  After trying to  manipulate the uterus, decision was finally made to perform a low vertical incision which ultimately was extended into the body of the uterus.  Artificial rupture of membranes occurred, clear amniotic fluid.  A floating vertex was encountered, which on initial attempt to deliver was unsuccessful.  Therefore, vacuum was used to help extract the live female who has had delayed cord clamping and the cord was subsequently clamped, cut.  The baby was transferred to the awaiting pediatricians, who assigned Apgars of 9 and 10 at one and five minutes. The placenta which was posterior was manually removed.  Uterine cavity was cleaned of debris.  The uterine incision was closed in 2 layers, the first layer was 0 Monocryl running lock stitch, second layer was imbricating with 0 Monocryl suture.  Normal tubes and ovaries were noted bilaterally.  The abdomen was irrigated and suctioned of debris. Interceed was placed overlying the incision.  The Alexis retractor was removed.  The parietal peritoneum was closed with 2-0 Vicryl.  The rectus fascia was closed with 0 Vicryl x2.  The subcutaneous area was irrigated, small bleeders cauterized.  Interrupted 2-0 plain sutures placed and the skin approximated using 4-0 Vicryl subcuticular closure. Steri-Strips and benzoin were then placed.  SPECIMENS:  Placenta not sent to Pathology.  ESTIMATED BLOOD LOSS:  856 mL.  URINE OUTPUT:  100 mL, clear yellow  urine.  INTRAOPERATIVE FLUID:  2800 mL.  Sponge and instrument counts x2 were correct.  COMPLICATIONS:  None.  The patient tolerated the procedure well, was transferred to recovery room in stable condition.     Maxie BetterSheronette Arriyah Madej, M.D.     [place]/MEDQ  D:  02/24/2017  T:  02/24/2017  Job:  [id number]

## 2017-02-25 NOTE — Progress Notes (Signed)
SVD: repeat  S:  Pt reports feeling  well/ Tolerating po/ Voiding without problems/ No n/v/ Bleeding is moderate/ Pain controlled withnarcotic analgesics including Percocet    O:  A & O x 3  stable/ VS: Blood pressure 122/68, pulse 67, temperature 98.6 F (37 C), temperature source Oral, resp. rate 18, height 5\' 4"  (1.626 m), weight 101.6 kg (224 lb), last menstrual period 06/03/2016, SpO2 98 %, unknown if currently breastfeeding.  LABS:  Results for orders placed or performed during the hospital encounter of 02/24/17 (from the past 24 hour(s))  CBC     Status: None   Collection Time: 02/24/17 12:40 PM  Result Value Ref Range   WBC 10.2 4.0 - 10.5 K/uL   RBC 4.21 3.87 - 5.11 MIL/uL   Hemoglobin 12.7 12.0 - 15.0 g/dL   HCT 16.1 09.6 - 04.5 %   MCV 87.2 78.0 - 100.0 fL   MCH 30.2 26.0 - 34.0 pg   MCHC 34.6 30.0 - 36.0 g/dL   RDW 40.9 81.1 - 91.4 %   Platelets 199 150 - 400 K/uL  RPR     Status: None   Collection Time: 02/24/17 12:40 PM  Result Value Ref Range   RPR Ser Ql Non Reactive Non Reactive  Comprehensive metabolic panel     Status: Abnormal   Collection Time: 02/24/17 12:40 PM  Result Value Ref Range   Sodium 136 135 - 145 mmol/L   Potassium 3.9 3.5 - 5.1 mmol/L   Chloride 106 101 - 111 mmol/L   CO2 22 22 - 32 mmol/L   Glucose, Bld 90 65 - 99 mg/dL   BUN 7 6 - 20 mg/dL   Creatinine, Ser 7.82 0.44 - 1.00 mg/dL   Calcium 9.5 8.9 - 95.6 mg/dL   Total Protein 6.7 6.5 - 8.1 g/dL   Albumin 3.4 (L) 3.5 - 5.0 g/dL   AST 22 15 - 41 U/L   ALT 21 14 - 54 U/L   Alkaline Phosphatase 111 38 - 126 U/L   Total Bilirubin 0.4 0.3 - 1.2 mg/dL   GFR calc non Af Amer >60 >60 mL/min   GFR calc Af Amer >60 >60 mL/min   Anion gap 8 5 - 15  Type and screen     Status: None   Collection Time: 02/24/17 12:40 PM  Result Value Ref Range   ABO/RH(D) O POS    Antibody Screen NEG    Sample Expiration 02/27/2017   CBC     Status: Abnormal   Collection Time: 02/25/17  5:35 AM  Result Value  Ref Range   WBC 12.5 (H) 4.0 - 10.5 K/uL   RBC 3.61 (L) 3.87 - 5.11 MIL/uL   Hemoglobin 10.8 (L) 12.0 - 15.0 g/dL   HCT 21.3 (L) 08.6 - 57.8 %   MCV 88.1 78.0 - 100.0 fL   MCH 29.9 26.0 - 34.0 pg   MCHC 34.0 30.0 - 36.0 g/dL   RDW 46.9 62.9 - 52.8 %   Platelets 164 150 - 400 K/uL    I&O: I/O last 3 completed shifts: In: 5213 [P.O.:900; I.V.:3600; Other:713] Out: 2555 [Urine:1700; Blood:855]   No intake/output data recorded.  Lungs: chest clear, no wheezing, rales, normal symmetric air entry, Heart exam - S1, S2 normal, no murmur, no gallop, rate regular  Heart: regular rate and rhythm, S1, S2 normal, no murmur, click, rub or gallop  Abdomen: soft nondistended. Uterus firm surgical tender. Pressure dressing in place  Perineum: is normal  Lochia: mod  Extremities:edema trace+     A/P: POD # 1/PPD # 1/ [id number] S/P C/S( repeat). Chronic HTn on med  Doing well  Continue routine post partum orders  Cont BP meds Change pressure dressing to honeycomb tomorrow

## 2017-02-25 NOTE — Lactation Note (Signed)
This note was copied from a baby's chart. Lactation Consultation Note  Patient Name: Kristen Thornton [id number] Date: 02/25/2017 Reason for consult: Initial assessment;1st time breastfeeding;Infant < 6lbs   Initial consult with mom of 21 hour old infant. Infant with 1 BF for 10 minutes, 2 BF attempts, formula x 6 via bottle of 2-22 cc, 3 voids. 4 stools and 1 emesis since birth. Infant weight 5 lb 7.3 oz with 2% weight loss since birth. LATCH scores 6.   Infant out of room for hearing screen. Offered mom DEBP, mom agreeable. DEBP set up with instructions for use on Initiate setting, assembling, disassembling and cleaning of pump parts. Mom pumped and did not obtain any colostrum. Worked with mom on hand expression.  Mom hand expressed and was able to get 3-4 gtts colostrum from each breast. Mom was pleased to see colostrum. Plan is to pump about every 3 hours post BF and follow with hand expression.   Discussed with mom that since infant is< 6 pounds that it is advised that infant is fed at least every 3 hours and with feeding cues. Discussed with mom to BF infant for 15-20 minutes and then stop and offer EBM/formula via bottle. Reviewed limiting stimulation to infant between feedings.   Infant returned from hearing screen and was cueing to feed. Assisted mom with pillow support and latching infant to left breast in the cross cradle hold. Infant fed on and off for 15 minutes. He was noted to have a few swallows. Mom did well with breast massage with feeding. He did have to be relatched several times throughout feeding, showed mom the teacup hold for latching. Mom with large compressible breasts and areola with short shaft everted nipples. When infant got tired mom offered infant the expressed EBM via syringe and then offered him a bottle of formula. Infant was still feeding from the bottle when LC left room. Reviewed supplementation amounts per day of age with mom.   Enc mom to offer breast with each  feeding at least every 3 hours or more often with feeding cues Follow breast feeding with supplementation of EBM/formula per supplementation guidelines Pump for 15 minutes on Initiate setting with DEBP Hand express post pumping Rest between feedings  Mom is to call insurance company to inquire about a DEBP for home use. Mom was informed of pump rentals through gift shop if needed. Enc mom to call out for feeding assistance as needed.   BF Resources Handout and LC Brochure given, mom informed of IP/OP Servics, BF Support Groups and LC phone #.    Maternal Data Formula Feeding for Exclusion: Yes Reason for exclusion: Mother's choice to formula and breast feed on admission Has patient been taught Hand Expression?: Yes Does the patient have breastfeeding experience prior to this delivery?: No  Feeding Feeding Type: Breast Fed Length of feed: 10 min  LATCH Score Latch: Repeated attempts needed to sustain latch, nipple held in mouth throughout feeding, stimulation needed to elicit sucking reflex.  Audible Swallowing: A few with stimulation  Type of Nipple: Everted at rest and after stimulation  Comfort (Breast/Nipple): Soft / non-tender  Hold (Positioning): Assistance needed to correctly position infant at breast and maintain latch.  LATCH Score: 7  Interventions Interventions: Breast feeding basics reviewed;Support pillows;Assisted with latch;Position options;Skin to skin;Expressed milk;Breast compression;DEBP  Lactation Tools Discussed/Used WIC Program: No Pump Review: Setup, frequency, and cleaning;Milk Storage Initiated by:: Noralee Stain, RN, IBCLC Date initiated:: 02/25/17   Consult Status Consult Status: Follow-up  Date: 02/26/17 Follow-up type: In-patient    Kristen FloodSharon S Jaz Thornton 02/25/2017, 3:25 PM

## 2017-02-26 LAB — BIRTH TISSUE RECOVERY COLLECTION (PLACENTA DONATION)

## 2017-02-26 MED ORDER — ERYTHROMYCIN 5 MG/GM OP OINT
TOPICAL_OINTMENT | OPHTHALMIC | Status: AC
  Filled 2017-02-26: qty 1

## 2017-02-26 NOTE — Lactation Note (Signed)
This note was copied from a baby's chart. Lactation Consultation Note  Patient Name: Kristen Otis PeakKaren Thornton [id number]'s Date: 02/26/2017 Reason for consult: Follow-up assessment  Follow-up at 47 hrs old; GA 38.0; BW 5 lbs, 8.7 oz.  3% weight loss.  Mom is P2.  Bilateral conjunctivitis. Mom states she is trying to breastfeed with each feeding although documentation only reflects one successful breastfeeding in chart in past 24 hrs + attempt x3 (0 min). Mom states when infant latches he is sucking.   Infant is being supplemented with formula after each feeding x12 (5-32 ml); voids-5 in 24 hrs/ 8 life; stools-9 in 24 hrs/ 13 life.  LC reviewed feeding plan written on board by previous LC and clarified questions and minor confusion. 1. Encouraged to hand express to start flow prior to latching infant, then latch and feed for 15-20 minutes since infant is <6 lbs to limit tiring infant with feedings.   2. Instructed to feed every 2.5 -3 hrs and with cues. 3. Encouraged mom to let FOB feed the formula supplementation (parents using bottles) while she pumps using DEBP 4. At end of pumping session hand express for an additional 10 minutes alternating between sides every 2-3 minutes for at least 2 rotations on each side.    Mom was appreciative of the information. Encouraged to call for assistance with feeding as needed.    Consult Status Consult Status: Follow-up Follow-up type: In-patient    Kristen Thornton, Kristen Thornton 02/26/2017, 5:44 PM

## 2017-02-26 NOTE — Progress Notes (Addendum)
Subjective: POD# 2 Information for the patient's newborn:  Kristen Thornton, Boy [name]  female    circ completed Baby name: Kristen Thornton  Reports feeling well Feeding: breast and bottle Patient reports tolerating PO.  Breast symptoms: good latch Pain controlled with PO meds Denies HA/SOB/C/P/N/V/dizziness. Flatus present. She reports vaginal bleeding as normal, without clots.  She is ambulating, urinating without difficulty.     Objective:   VS:    Vitals:   02/25/17 2120 02/25/17 2138 02/26/17 0500 02/26/17 0902  BP: 122/73 122/73 (!) 125/50 130/68  Pulse: 81 81 78 80  Resp:  18 16   Temp:  99 F (37.2 C) 98.1 F (36.7 C)   TempSrc:  Oral Oral   SpO2:  98%    Weight:      Height:         Intake/Output Summary (Last 24 hours) at 02/26/17 1004 Last data filed at 02/25/17 1300  Gross per 24 hour  Intake                0 ml  Output              950 ml  Net             -950 ml        Recent Labs  02/24/17 1240 02/25/17 0535  WBC 10.2 12.5*  HGB 12.7 10.8*  HCT 36.7 31.8*  PLT 199 164     Blood type: --/--/O POS (10/18 1240)  Rubella:   immune    Physical Exam:  General: alert, cooperative and no distress Abdomen: soft, nontender, normal bowel sounds Incision: pressure dressing removed, moderate amt old serum drainage, honeycomb applied, incision w/o rednaees or discharge at present Uterine Fundus: firm, below umbilicus, nontender Lochia: moderate Ext: no edema, redness or tenderness in the calves or thighs      Assessment/Plan: 41 y.o.   POD# 2. [id number]                  Principal Problem:   Postpartum care following cesarean delivery 10/18 Active Problems:   Chronic hypertension complicating or reason for care during pregnancy   Cesarean delivery delivered: Indication: previous classic C/S   Doing well, stable.    Normotensive on   Labetalol 400 mg BID and diltiazem 240 mg QD, no neural s/s     Routine post-op care Anticipate DC in  AM  Neta Mendsaniela C Paul, CNM, MSN 02/26/2017, 10:04 AM

## 2017-02-27 MED ORDER — COCONUT OIL OIL: 1.0000 "application " | TOPICAL_OIL | 0 refills | Status: AC | PRN

## 2017-02-27 MED ORDER — OXYCODONE-ACETAMINOPHEN 5-325 MG PO TABS: 1.0000 | ORAL_TABLET | ORAL | 0 refills | Status: AC | PRN

## 2017-02-27 MED ORDER — SENNOSIDES-DOCUSATE SODIUM 8.6-50 MG PO TABS: 2.0000 | ORAL_TABLET | ORAL | Status: AC

## 2017-02-27 MED ORDER — IBUPROFEN 600 MG PO TABS: 600.0000 mg | ORAL_TABLET | Freq: Four times a day (QID) | ORAL | 0 refills | Status: AC

## 2017-02-27 MED ORDER — SIMETHICONE 80 MG PO CHEW: 80.0000 mg | CHEWABLE_TABLET | Freq: Three times a day (TID) | ORAL | 0 refills | Status: AC

## 2017-02-27 NOTE — Discharge Summary (Signed)
OB Discharge Summary     Patient Name: Kristen Thornton DOB: 1976/04/30 MRN: [id number]  Date of admission: 02/24/2017 Delivering MD: Rakesh Dutko   Date of discharge: 02/27/2017  Admitting diagnosis: CHTN, absent end diastolic flow, previous Cesarean section, IUGR Intrauterine pregnancy: [id number]     Secondary diagnosis:  Principal Problem:   Postpartum care following cesarean delivery 10/18 Active Problems:   Chronic hypertension complicating or reason for care during pregnancy   Cesarean delivery delivered: Indication: previous C/S     Discharge diagnosis: Term Pregnancy Delivered, CHTN and IUGR, previous Cesarean section                                                                                                Post partum procedures:none  Complications: None  Hospital course:  Sceduled C/S   41 y.o. yo G2P2002 at 1364w0d was admitted to the hospital 02/24/2017 for scheduled cesarean section with the following indication:Prior Uterine Surgery and abnormal fetal testing, IUGR.  Membrane Rupture Time/Date: 6:04 PM ,02/24/2017   Patient delivered a Viable infant.02/24/2017  Details of operation can be found in separate operative note.  Pateint had an uncomplicated postpartum course.  She is ambulating, tolerating a regular diet, passing flatus, and urinating well. Patient is discharged home in stable condition on  02/27/17         Physical exam  Vitals:   02/26/17 2023 02/26/17 2026 02/27/17 0625 02/27/17 0900  BP: 136/85 136/85 121/71 129/87  Pulse: 77 77 78 81  Resp:   20 18  Temp:   97.9 F (36.6 C)   TempSrc:   Oral   SpO2:    100%  Weight:      Height:       General: alert, cooperative and no distress Lochia: appropriate Uterine Fundus: firm Incision: Healing well with no significant drainage, Dressing is clean, dry, and intact DVT Evaluation: No cords or calf tenderness. No significant calf/ankle edema. Labs: Lab Results  Component Value Date   WBC  12.5 (H) 02/25/2017   HGB 10.8 (L) 02/25/2017   HCT 31.8 (L) 02/25/2017   MCV 88.1 02/25/2017   PLT 164 02/25/2017   CMP Latest Ref Rng & Units 02/24/2017  Glucose 65 - 99 mg/dL 90  BUN 6 - 20 mg/dL 7  Creatinine 4.090.44 - 8.111.00 mg/dL 9.140.53  Sodium 782135 - 956145 mmol/L 136  Potassium 3.5 - 5.1 mmol/L 3.9  Chloride 101 - 111 mmol/L 106  CO2 22 - 32 mmol/L 22  Calcium 8.9 - 10.3 mg/dL 9.5  Total Protein 6.5 - 8.1 g/dL 6.7  Total Bilirubin 0.3 - 1.2 mg/dL 0.4  Alkaline Phos 38 - 126 U/L 111  AST 15 - 41 U/L 22  ALT 14 - 54 U/L 21    Discharge instruction: per After Visit Summary and "Baby and Me Booklet".  After visit meds:  Allergies as of 02/27/2017   No Known Allergies     Medication List    STOP taking these medications   IRON PO     TAKE these medications   calcium carbonate 500 MG chewable  tablet Commonly known as:  TUMS - dosed in mg elemental calcium Chew 2 tablets by mouth 2 (two) times daily as needed for indigestion or heartburn.   coconut oil Oil Apply 1 application topically as needed.   DILT-XR 240 MG 24 hr capsule Generic drug:  diltiazem Take 240 mg by mouth daily.   ibuprofen 600 MG tablet Commonly known as:  ADVIL,MOTRIN Take 1 tablet (600 mg total) by mouth every 6 (six) hours.   labetalol 300 MG tablet Commonly known as:  NORMODYNE Take 300 mg by mouth 2 (two) times daily. Take with 100mg  tablet for complete dose of 400mg  twice daily   labetalol 100 MG tablet Commonly known as:  NORMODYNE Take 100 mg by mouth 2 (two) times daily. Take with 300mg  tablet for complete dose of 400mg  twice daily   oxyCODONE-acetaminophen 5-325 MG tablet Commonly known as:  PERCOCET/ROXICET Take 1 tablet by mouth every 4 (four) hours as needed (pain scale 4-7).   prenatal multivitamin Tabs tablet Take 1 tablet by mouth daily at 12 noon.   senna-docusate 8.6-50 MG tablet Commonly known as:  Senokot-S Take 2 tablets by mouth daily.   simethicone 80 MG chewable  tablet Commonly known as:  MYLICON Chew 1 tablet (80 mg total) by mouth 3 (three) times daily after meals.       Diet: routine diet  Activity: Advance as tolerated. Pelvic rest for 6 weeks.   Outpatient follow up:2 weeks  Postpartum contraception: Not Discussed  Newborn Data: Live born female Littie Deeds Birth Weight: 5 lb 8.7 oz (2515 g) APGAR: 9, 10  Newborn Delivery   Birth date/time:  02/24/2017 18:04:00 Delivery type:  C-Section, Classical  C-section categorization:  Repeat     Baby Feeding: Bottle and Breast Disposition:home with mother   02/27/2017 Neta Mends, CNM

## 2017-03-07 ENCOUNTER — Encounter (HOSPITAL_COMMUNITY): Admission: RE | Admit: 2017-03-07 | Payer: BLUE CROSS/BLUE SHIELD | Source: Ambulatory Visit

## 2017-03-08 ENCOUNTER — Encounter (HOSPITAL_COMMUNITY): Admission: RE | Payer: Self-pay | Source: Ambulatory Visit

## 2017-03-08 ENCOUNTER — Inpatient Hospital Stay (HOSPITAL_COMMUNITY)
Admission: RE | Admit: 2017-03-08 | Payer: BLUE CROSS/BLUE SHIELD | Source: Ambulatory Visit | Admitting: Obstetrics and Gynecology

## 2017-03-08 SURGERY — Surgical Case
Anesthesia: Regional

## 2018-12-15 ENCOUNTER — Other Ambulatory Visit: Payer: Self-pay

## 2018-12-15 DIAGNOSIS — Z20822 Contact with and (suspected) exposure to covid-19: Secondary | ICD-10-CM

## 2018-12-17 LAB — NOVEL CORONAVIRUS, NAA: SARS-CoV-2, NAA: NOT DETECTED

## 2023-10-11 ENCOUNTER — Encounter (INDEPENDENT_AMBULATORY_CARE_PROVIDER_SITE_OTHER): Payer: Self-pay | Admitting: *Deleted

## 2024-04-11 ENCOUNTER — Encounter (INDEPENDENT_AMBULATORY_CARE_PROVIDER_SITE_OTHER): Payer: Self-pay | Admitting: *Deleted
